# Patient Record
Sex: Female | Born: 1950 | Hispanic: Yes | Smoking: Never smoker
Health system: Southern US, Community
[De-identification: ages and names within clinical notes are randomized; demographics above are authoritative.]

## PROBLEM LIST (undated history)

## (undated) DIAGNOSIS — I10 Essential (primary) hypertension: Secondary | ICD-10-CM

## (undated) HISTORY — PX: OTHER SURGICAL HISTORY: SHX169

## (undated) HISTORY — DX: Essential (primary) hypertension: I10

## (undated) HISTORY — PX: HERNIA REPAIR: SHX51

---

## 2010-04-19 ENCOUNTER — Ambulatory Visit: Payer: Self-pay | Admitting: Gynecology

## 2010-04-19 ENCOUNTER — Other Ambulatory Visit
Admission: RE | Admit: 2010-04-19 | Discharge: 2010-04-19 | Payer: Self-pay | Source: Home / Self Care | Admitting: Gynecology

## 2010-05-22 ENCOUNTER — Ambulatory Visit
Admission: RE | Admit: 2010-05-22 | Discharge: 2010-05-22 | Payer: Self-pay | Source: Home / Self Care | Attending: Gynecology | Admitting: Gynecology

## 2010-06-17 ENCOUNTER — Emergency Department (HOSPITAL_COMMUNITY)
Admission: EM | Admit: 2010-06-17 | Discharge: 2010-06-17 | Disposition: A | Discharge status: Home / Self Care | Payer: BC Managed Care – PPO | Attending: Emergency Medicine | Admitting: Emergency Medicine

## 2010-06-17 DIAGNOSIS — I498 Other specified cardiac arrhythmias: Secondary | ICD-10-CM | POA: Insufficient documentation

## 2010-06-17 DIAGNOSIS — I10 Essential (primary) hypertension: Secondary | ICD-10-CM | POA: Insufficient documentation

## 2010-06-17 LAB — DIFFERENTIAL
Basophils Absolute: 0 K/uL (ref 0.0–0.1)
Basophils Relative: 0 % (ref 0–1)
Eosinophils Absolute: 0.2 10*3/uL (ref 0.0–0.7)
Eosinophils Relative: 3 % (ref 0–5)
Lymphocytes Relative: 33 % (ref 12–46)
Lymphs Abs: 2.2 10*3/uL (ref 0.7–4.0)
Monocytes Absolute: 0.5 K/uL (ref 0.1–1.0)
Monocytes Relative: 7 % (ref 3–12)
Neutro Abs: 3.9 K/uL (ref 1.7–7.7)
Neutrophils Relative %: 57 % (ref 43–77)

## 2010-06-17 LAB — BASIC METABOLIC PANEL WITH GFR
CO2: 29 meq/L (ref 19–32)
Calcium: 9.5 mg/dL (ref 8.4–10.5)
Chloride: 102 meq/L (ref 96–112)
Glucose, Bld: 99 mg/dL (ref 70–99)
Sodium: 139 meq/L (ref 135–145)

## 2010-06-17 LAB — POCT CARDIAC MARKERS
CKMB, poc: <1 ng/mL — ABNORMAL LOW (ref 1.0–8.0)
Myoglobin, poc: 63.4 ng/mL (ref 12–200)
Troponin i, poc: <0.05 ng/mL (ref 0.00–0.09)

## 2010-06-17 LAB — BASIC METABOLIC PANEL
BUN: 13 mg/dL (ref 6–23)
Creatinine, Ser: 0.66 mg/dL (ref 0.4–1.2)
GFR calc Af Amer: >60 mL/min (ref >60)
GFR calc non Af Amer: >60 mL/min (ref >60)
Potassium: 3.8 mEq/L (ref 3.5–5.1)

## 2010-06-17 LAB — CBC
HCT: 39.7 % (ref 36.0–46.0)
Hemoglobin: 13.2 g/dL (ref 12.0–15.0)
MCH: 30 pg (ref 26.0–34.0)
MCHC: 33.2 g/dL (ref 30.0–36.0)
MCV: 90.2 fL (ref 78.0–100.0)
Platelets: 230 10*3/uL (ref 150–400)
RBC: 4.4 MIL/uL (ref 3.87–5.11)
RDW: 13.7 % (ref 11.5–15.5)
WBC: 6.8 10*3/uL (ref 4.0–10.5)

## 2015-09-08 ENCOUNTER — Telehealth: Payer: Self-pay | Admitting: Internal Medicine

## 2015-09-08 NOTE — Telephone Encounter (Signed)
Caller name: Teresa Burch Relationship to patient: sister Can be reached: [phone]   Reason for call: Teresa Burch is current pt with Dr. Drue NovelPaz and she would like him to see pt (her sister). Pt has [employer] M[employer]. Please advise.

## 2015-09-08 NOTE — Telephone Encounter (Signed)
Please have Pt send medical records before appt.

## 2015-09-08 NOTE — Telephone Encounter (Signed)
That is okay, schedule at her convenience 

## 2015-09-12 NOTE — Telephone Encounter (Signed)
LM to set up new pt appt (non urgent)

## 2015-12-06 ENCOUNTER — Ambulatory Visit: Payer: Self-pay | Admitting: Internal Medicine

## 2016-01-08 ENCOUNTER — Telehealth: Payer: Self-pay | Admitting: Behavioral Health

## 2016-01-08 NOTE — Telephone Encounter (Signed)
Attempted to reach patient for Pre-Visit Call. Unable to leave a message; voicemail unavailable.

## 2016-01-09 ENCOUNTER — Ambulatory Visit (INDEPENDENT_AMBULATORY_CARE_PROVIDER_SITE_OTHER): Payer: Medicare HMO | Admitting: Internal Medicine

## 2016-01-09 ENCOUNTER — Encounter: Payer: Self-pay | Admitting: Internal Medicine

## 2016-01-09 VITALS — BP 126/74 | HR 88 | Temp 97.8°F | Resp 14 | Ht 63.5 in | Wt 106.0 lb

## 2016-01-09 DIAGNOSIS — Z0000 Encounter for general adult medical examination without abnormal findings: Secondary | ICD-10-CM | POA: Diagnosis not present

## 2016-01-09 DIAGNOSIS — Z1231 Encounter for screening mammogram for malignant neoplasm of breast: Secondary | ICD-10-CM

## 2016-01-09 DIAGNOSIS — Z01419 Encounter for gynecological examination (general) (routine) without abnormal findings: Secondary | ICD-10-CM

## 2016-01-09 DIAGNOSIS — I10 Essential (primary) hypertension: Secondary | ICD-10-CM

## 2016-01-09 MED ORDER — LOSARTAN POTASSIUM-HCTZ 100-25 MG PO TABS: 1.0000 | ORAL_TABLET | Freq: Every day | ORAL | 2 refills | Status: DC

## 2016-01-09 NOTE — Progress Notes (Signed)
Pre visit review using our clinic review tool, if applicable. No additional management support is needed unless otherwise documented below in the visit note. 

## 2016-01-09 NOTE — Progress Notes (Signed)
Subjective:    Patient ID: Teresa Burch, female    DOB: Jul 12, 1950, 65 y.o.   MRN: [id number]  DOS:  01/09/2016 Type of visit - description : new pt, request a CPX, here w/ her sister  Interval history: Has a history of hypertension, good compliance of medication without apparent side effects. In general feels well.   Review of Systems Constitutional: No fever. No chills. No unexplained wt changes. No unusual sweats  HEENT: No dental problems, no ear discharge, no facial swelling, no voice changes. No eye discharge, no eye  redness , no  intolerance to light   Respiratory: No wheezing , no  difficulty breathing. No cough , no mucus production  Cardiovascular: No CP, no leg swelling , no  Palpitations  GI: no nausea, no vomiting, no diarrhea , no  abdominal pain.  No blood in the stools. No dysphagia, no odynophagia    Endocrine: No polyphagia, no polyuria , no polydipsia  GU: No dysuria, gross hematuria, difficulty urinating. No urinary urgency, no frequency.  Musculoskeletal: No joint swellings or unusual aches or pains  Skin: No change in the color of the skin, palor , no  Rash  Allergic, immunologic: No environmental allergies , no  food allergies  Neurological: No dizziness no  syncope. No headaches. No diplopia, no slurred, no slurred speech, no motor deficits, no facial  Numbness  Hematological: No enlarged lymph nodes, no easy bruising , no unusual bleedings  Psychiatry: No suicidal ideas, no hallucinations, no beavior problems, no confusion.  No unusual/severe anxiety, no depression   Past Medical History:  Diagnosis Date  . Hypertension     Past Surgical History:  Procedure Laterality Date  . BTL     1980s  . HERNIA REPAIR  ~2005    Social History   Social History  . Marital status: Legally Separated    Spouse name: N/A  . Number of children: 3  . Years of education: N/A   Occupational History  . retired, [employer]    Social History Main  Topics  . Smoking status: Never Smoker  . Smokeless tobacco: Never Used  . Alcohol use No  . Drug use: No  . Sexual activity: Not on file   Other Topics Concern  . Not on file   Social History Narrative   From [place]   Moved to [place] 1967     Family History  Problem Relation Age of Onset  . CAD Father     MI age 67  . Lung cancer Sister   . Breast cancer Sister   . Diabetes Neg Hx   . Colon cancer Neg Hx       Medication List       Accurate as of 01/09/16 11:59 PM. Always use your most recent med list.          losartan-hydrochlorothiazide 100-25 MG tablet Commonly known as:  HYZAAR Take 1 tablet by mouth daily.   PROBIOTIC DAILY PO Take 1 tablet by mouth daily.          Objective:   Physical Exam BP 126/74 (BP Location: Left Arm, Patient Position: Sitting, Cuff Size: Normal)   Pulse 88   Temp 97.8 F (36.6 C) (Oral)   Resp 14   Ht 5' 3.5" (1.613 m)   Wt 106 lb (48.1 kg)   SpO2 97%   BMI 18.48 kg/m   General:   Well developed, well nourished . NAD.  Neck: No  thyromegaly  HEENT:  Normocephalic . Face symmetric, atraumatic Lungs:  CTA B Normal respiratory effort, no intercostal retractions, no accessory muscle use. Heart: RRR,  no murmur.  No pretibial edema bilaterally  Abdomen:  Not distended, soft, non-tender. No rebound or rigidity.   Skin: Exposed areas without rash. Not pale. Not jaundice Neurologic:  alert & oriented X3.  Speech normal, gait appropriate for age and unassisted Strength symmetric and appropriate for age.  Psych: Cognition and judgment appear intact.  Cooperative with normal attention span and concentration.  Behavior appropriate. No anxious or depressed appearing.    Assessment & Plan:   Assessment  (new pt 12-2015, previous PCP dr Hilma Favors, @ [place], [place]) HTN - dx ~ 2015  Plan: HTN: Seems well-controlled on Hyzaar, check labs, refill medications . RTC 6 months

## 2016-01-09 NOTE — Patient Instructions (Addendum)
GO TO THE LAB : Get the blood work     GO TO THE FRONT DESK Schedule your next appointment for a  checkup in 6 months   Front desk: get a ROI from the patient and fax it to Dr Hilma FavorsSchaeffer, @ [place], [place] Request all records

## 2016-01-09 NOTE — Assessment & Plan Note (Addendum)
Immunizations? Does not recall any previous immunizations except for flu shot, she had it twice before, "got sick", strongly declined a flu shot today. Will get records from previous PCP Female care:  -Saw Dr. Lily PeerFernandez few years ago, refer back to him - Rx a mammogram  -Bone density test done@gynecology . Colon cancer screening: Reports never had a colonoscopy, discuss screening modalities, elected IFOB Diet and exercise discussed Labs: CMP, FLP, CBC, TSH, vitamin D. EKG: Sinus rhythm , no acute changes Q wave in V1

## 2016-01-10 DIAGNOSIS — Z09 Encounter for follow-up examination after completed treatment for conditions other than malignant neoplasm: Secondary | ICD-10-CM | POA: Insufficient documentation

## 2016-01-10 LAB — CBC WITH DIFFERENTIAL/PLATELET
BASOS PCT: 0.4 % (ref 0.0–3.0)
Basophils Absolute: 0 10*3/uL (ref 0.0–0.1)
EOS PCT: 2.8 % (ref 0.0–5.0)
Eosinophils Absolute: 0.2 10*3/uL (ref 0.0–0.7)
HCT: 38.1 % (ref 36.0–46.0)
HEMOGLOBIN: 12.9 g/dL (ref 12.0–15.0)
LYMPHS ABS: 2.1 10*3/uL (ref 0.7–4.0)
Lymphocytes Relative: 32.2 % (ref 12.0–46.0)
MCHC: 33.9 g/dL (ref 30.0–36.0)
MCV: 87.8 fl (ref 78.0–100.0)
MONO ABS: 0.5 10*3/uL (ref 0.1–1.0)
MONOS PCT: 7.7 % (ref 3.0–12.0)
NEUTROS PCT: 56.9 % (ref 43.0–77.0)
Neutro Abs: 3.8 10*3/uL (ref 1.4–7.7)
Platelets: 232 10*3/uL (ref 150.0–400.0)
RBC: 4.34 Mil/uL (ref 3.87–5.11)
RDW: 14.1 % (ref 11.5–15.5)
WBC: 6.7 10*3/uL (ref 4.0–10.5)

## 2016-01-10 LAB — TSH: TSH: 1.24 u[IU]/mL (ref 0.35–4.50)

## 2016-01-10 LAB — COMPREHENSIVE METABOLIC PANEL
ALBUMIN: 3.9 g/dL (ref 3.5–5.2)
ALK PHOS: 52 U/L (ref 39–117)
ALT: 17 U/L (ref 0–35)
AST: 22 U/L (ref 0–37)
BILIRUBIN TOTAL: 0.7 mg/dL (ref 0.2–1.2)
BUN: 13 mg/dL (ref 6–23)
CALCIUM: 9.1 mg/dL (ref 8.4–10.5)
CO2: 34 mEq/L — ABNORMAL HIGH (ref 19–32)
Chloride: 99 mEq/L (ref 96–112)
Creatinine, Ser: 0.75 mg/dL (ref 0.40–1.20)
GFR: 81.79 mL/min (ref >60.00)
Glucose, Bld: 83 mg/dL (ref 70–99)
POTASSIUM: 4.1 meq/L (ref 3.5–5.1)
Sodium: 137 mEq/L (ref 135–145)
TOTAL PROTEIN: 7 g/dL (ref 6.0–8.3)

## 2016-01-10 LAB — LIPID PANEL
CHOLESTEROL: 211 mg/dL — AB (ref 0–200)
HDL: 49.9 mg/dL (ref >39.00)
LDL Cholesterol: 140 mg/dL — ABNORMAL HIGH (ref 0–99)
NONHDL: 160.99
TRIGLYCERIDES: 104 mg/dL (ref 0.0–149.0)
Total CHOL/HDL Ratio: 4
VLDL: 20.8 mg/dL (ref 0.0–40.0)

## 2016-01-10 NOTE — Assessment & Plan Note (Signed)
HTN: Seems well-controlled on Hyzaar, check labs, refill medications . RTC 6 months

## 2016-01-12 LAB — VITAMIN D 1,25 DIHYDROXY
VITAMIN D 1, 25 (OH) TOTAL: 36 pg/mL (ref 18–72)
VITAMIN D3 1, 25 (OH): 36 pg/mL
Vitamin D2 1, 25 (OH)2: <8 pg/mL

## 2016-02-08 ENCOUNTER — Ambulatory Visit (INDEPENDENT_AMBULATORY_CARE_PROVIDER_SITE_OTHER): Payer: Medicare HMO | Admitting: Gynecology

## 2016-02-08 ENCOUNTER — Encounter: Payer: Self-pay | Admitting: Gynecology

## 2016-02-08 VITALS — BP 130/82 | Ht 63.0 in | Wt 173.0 lb

## 2016-02-08 DIAGNOSIS — Z01419 Encounter for gynecological examination (general) (routine) without abnormal findings: Secondary | ICD-10-CM

## 2016-02-08 DIAGNOSIS — Z780 Asymptomatic menopausal state: Secondary | ICD-10-CM

## 2016-02-08 NOTE — Patient Instructions (Signed)
Densitometra sea (Bone Densitometry) La densitometra sea es un estudio de diagnstico por imgenes en el que se utiliza una radiografa especial que mide la cantidad de calcio y otros minerales en los huesos (densidad sea). Este estudio tambin se conoce como examen de densidad mineral sea o radioabsorciometra de doble energa (DEXA). Puede medir la densidad sea en la cadera y la columna. Es similar a una radiografa comn. Tambin pueden hacerle este estudio para:  Diagnosticar una enfermedad que causa huesos dbiles o delgados (osteoporosis).  Predecir el riesgo de un hueso roto (fractura).  Determinar si el tratamiento para la osteoporosis funciona. INFORME A SU MDICO:  Cualquier alergia que tenga.  Todos los medicamentos que utiliza, incluidos vitaminas, hierbas, gotas oftlmicas, cremas y medicamentos de venta libre.  Problemas previos que usted o los miembros de su familia hayan tenido con el uso de anestsicos.  Enfermedades de la sangre que tenga.  Si tiene cirugas previas.  Enfermedades que tenga.  Probabilidad de embarazo.  Cualquier otro estudio mdico al que se haya sometido en los ltimos 14 das en el que se haya utilizado material de contraste. RIESGOS Y COMPLICACIONES En general, se trata de un procedimiento seguro. Sin embargo, pueden ocurrir algunos problemas, entre los que se pueden incluir los siguientes:  Este estudio lo expone a una cantidad muy pequea de radiacin.  Los riesgos de la exposicin a la radiacin pueden ser mayores para los nios por nacer. ANTES DEL PROCEDIMIENTO  No tome ningn suplemento de calcio durante 24 horas antes de realizarse el estudio. Puede comer y beber como lo hace habitualmente.  Qutese todas las joyas de metal, anteojos, aparatos dentales y cualquier otro objeto metlico. PROCEDIMIENTO  Deber recostarse en una camilla. Un generador de rayos X estar ubicado debajo de usted y un dispositivo de imgenes, por  encima.  Se pueden usar otros dispositivos, como cajas o abrazaderas, para posicionar el cuerpo apropiadamente para la exploracin.  Deber permanecer inmvil mientras la mquina explore lentamente su cuerpo.  Las imgenes se muestran en el monitor de una computadora. DESPUS DEL PROCEDIMIENTO Es posible que necesite estudios adicionales ms adelante.   Esta informacin no tiene como fin reemplazar el consejo del mdico. Asegrese de hacerle al mdico cualquier pregunta que tenga.   Document Released: 01/01/2012 Document Revised: 04/29/2014 Elsevier Interactive Patient Education 2016 Elsevier Inc.  

## 2016-02-08 NOTE — Addendum Note (Signed)
Addended by: Berna SpareASTILLO, Tashunda Vandezande A on: 02/08/2016 11:50 AM   Modules accepted: Orders

## 2016-02-08 NOTE — Progress Notes (Signed)
Teresa Burch [birth year] [id number]   History:    65 y.o.  for annual gyn exam who has not been seen the practice since 2012. Her PCP is Dr. Drue NovelPaz who is treating her for hypertension and doing her blood work. See separate note. Patient postmenopausal never been on hormone replacement therapy. When she was last here in the office in 2012 she had a normal bone density study. She has not had a mammogram in over 8 years. She does do her monthly breast exams. She declined the flu vaccine today. She has not had a colonoscopy as of yet.  Past medical history,surgical history, family history and social history were all reviewed and documented in the EPIC chart.  Gynecologic History No LMP recorded. Patient has had a hysterectomy. Contraception: post menopausal status Last Pap: 2011. Results were: normal Last mammogram: Over 8 years ago. Results were: normal  Obstetric History OB History  Gravida Para Term Preterm AB Living  5 3     2 3   SAB TAB Ectopic Multiple Live Births  2            # Outcome Date GA Lbr Len/2nd Weight Sex Delivery Anes PTL Lv  5 SAB           4 SAB           3 Para           2 Para           1 Para                ROS: A ROS was performed and pertinent positives and negatives are included in the history.  GENERAL: No fevers or chills. HEENT: No change in vision, no earache, sore throat or sinus congestion. NECK: No pain or stiffness. CARDIOVASCULAR: No chest pain or pressure. No palpitations. PULMONARY: No shortness of breath, cough or wheeze. GASTROINTESTINAL: No abdominal pain, nausea, vomiting or diarrhea, melena or bright red blood per rectum. GENITOURINARY: No urinary frequency, urgency, hesitancy or dysuria. MUSCULOSKELETAL: No joint or muscle pain, no back pain, no recent trauma. DERMATOLOGIC: No rash, no itching, no lesions. ENDOCRINE: No polyuria, polydipsia, no heat or cold intolerance. No recent change in weight. HEMATOLOGICAL: No anemia or easy bruising or  bleeding. NEUROLOGIC: No headache, seizures, numbness, tingling or weakness. PSYCHIATRIC: No depression, no loss of interest in normal activity or change in sleep pattern.     Exam: chaperone present  BP 130/82   Ht 5\' 3"  (1.6 m)   Wt 173 lb (78.5 kg)   BMI 30.65 kg/m   Body mass index is 30.65 kg/m.  General appearance : Well developed well nourished female. No acute distress HEENT: Eyes: no retinal hemorrhage or exudates,  Neck supple, trachea midline, no carotid bruits, no thyroidmegaly Lungs: Clear to auscultation, no rhonchi or wheezes, or rib retractions  Heart: Regular rate and rhythm, no murmurs or gallops Breast:Examined in sitting and supine position were symmetrical in appearance, no palpable masses or tenderness,  no skin retraction, no nipple inversion, no nipple discharge, no skin discoloration, no axillary or supraclavicular lymphadenopathy Abdomen: no palpable masses or tenderness, no rebound or guarding Extremities: no edema or skin discoloration or tenderness  Pelvic:  Bartholin, Urethra, Skene Glands: Within normal limits             Vagina: No gross lesions or discharge, atrophic changes  Cervix: No gross lesions or discharge  Uterus  anteverted, normal size, shape and consistency, non-tender  and mobile  Adnexa  Without masses or tenderness  Anus and perineum  normal   Rectovaginal  normal sphincter tone without palpated masses or tenderness             Hemoccult PCP provides     Assessment/Plan:  65 y.o. female for annual exam who was long overdue for this evaluation today. A Pap smear with HPV screening was done today. PCP has been done her blood work. Patient declined flu vaccine. I provided her with a requisition to schedule her mammogram. She will also schedule for a bone density study here in our office. We discussed importance of calcium vitamin D and weightbearing exercises for osteoporosis prevention. We discussed importance of monthly breast  exams.   Ok Edwards MD, 11:41 AM 02/08/2016

## 2016-02-12 LAB — PAP, TP IMAGING W/ HPV RNA, RFLX HPV TYPE 16,18/45: HPV mRNA, High Risk: NOT DETECTED

## 2016-03-11 ENCOUNTER — Encounter (HOSPITAL_BASED_OUTPATIENT_CLINIC_OR_DEPARTMENT_OTHER): Payer: Self-pay | Admitting: Radiology

## 2016-03-11 ENCOUNTER — Ambulatory Visit (HOSPITAL_BASED_OUTPATIENT_CLINIC_OR_DEPARTMENT_OTHER)
Admission: RE | Admit: 2016-03-11 | Discharge: 2016-03-11 | Disposition: A | Discharge status: Home / Self Care | Payer: Medicare HMO | Source: Ambulatory Visit | Attending: Internal Medicine | Admitting: Internal Medicine

## 2016-03-11 ENCOUNTER — Ambulatory Visit (HOSPITAL_BASED_OUTPATIENT_CLINIC_OR_DEPARTMENT_OTHER)
Admission: RE | Admit: 2016-03-11 | Discharge: 2016-03-11 | Disposition: A | Discharge status: Home / Self Care | Payer: Medicare HMO | Source: Ambulatory Visit | Attending: Gynecology | Admitting: Gynecology

## 2016-03-11 DIAGNOSIS — Z780 Asymptomatic menopausal state: Secondary | ICD-10-CM

## 2016-03-11 DIAGNOSIS — Z1231 Encounter for screening mammogram for malignant neoplasm of breast: Secondary | ICD-10-CM | POA: Insufficient documentation

## 2016-03-19 ENCOUNTER — Other Ambulatory Visit: Payer: Self-pay | Admitting: Internal Medicine

## 2016-03-19 DIAGNOSIS — R928 Other abnormal and inconclusive findings on diagnostic imaging of breast: Secondary | ICD-10-CM

## 2016-03-22 HISTORY — PX: BREAST BIOPSY: SHX20

## 2016-03-26 ENCOUNTER — Other Ambulatory Visit: Payer: Medicare HMO

## 2016-03-29 ENCOUNTER — Other Ambulatory Visit (INDEPENDENT_AMBULATORY_CARE_PROVIDER_SITE_OTHER): Payer: Medicare HMO

## 2016-03-29 DIAGNOSIS — Z0000 Encounter for general adult medical examination without abnormal findings: Secondary | ICD-10-CM | POA: Diagnosis not present

## 2016-03-29 LAB — FECAL OCCULT BLOOD, IMMUNOCHEMICAL: FECAL OCCULT BLD: NEGATIVE

## 2016-04-03 ENCOUNTER — Other Ambulatory Visit: Payer: Self-pay | Admitting: Internal Medicine

## 2016-04-03 ENCOUNTER — Ambulatory Visit
Admission: RE | Admit: 2016-04-03 | Discharge: 2016-04-03 | Disposition: A | Discharge status: Home / Self Care | Payer: Medicare HMO | Source: Ambulatory Visit | Attending: Internal Medicine | Admitting: Internal Medicine

## 2016-04-03 DIAGNOSIS — R928 Other abnormal and inconclusive findings on diagnostic imaging of breast: Secondary | ICD-10-CM

## 2016-04-03 DIAGNOSIS — N6320 Unspecified lump in the left breast, unspecified quadrant: Secondary | ICD-10-CM

## 2016-04-04 ENCOUNTER — Ambulatory Visit
Admission: RE | Admit: 2016-04-04 | Discharge: 2016-04-04 | Disposition: A | Discharge status: Home / Self Care | Payer: Medicare HMO | Source: Ambulatory Visit | Attending: Internal Medicine | Admitting: Internal Medicine

## 2016-04-04 ENCOUNTER — Other Ambulatory Visit: Payer: Self-pay | Admitting: Internal Medicine

## 2016-04-04 DIAGNOSIS — N6320 Unspecified lump in the left breast, unspecified quadrant: Secondary | ICD-10-CM

## 2016-07-08 ENCOUNTER — Ambulatory Visit (INDEPENDENT_AMBULATORY_CARE_PROVIDER_SITE_OTHER): Payer: Medicare HMO | Admitting: Internal Medicine

## 2016-07-08 ENCOUNTER — Encounter: Payer: Self-pay | Admitting: Internal Medicine

## 2016-07-08 VITALS — BP 124/78 | HR 71 | Temp 97.8°F | Resp 14 | Ht 63.0 in | Wt 177.1 lb

## 2016-07-08 DIAGNOSIS — I10 Essential (primary) hypertension: Secondary | ICD-10-CM | POA: Diagnosis not present

## 2016-07-08 MED ORDER — LOSARTAN POTASSIUM-HCTZ 100-25 MG PO TABS: 1.0000 | ORAL_TABLET | Freq: Every day | ORAL | 2 refills | Status: DC

## 2016-07-08 NOTE — Assessment & Plan Note (Signed)
HTN: Seems well-controlled with Hyzaar, check a BMP and refill medications. Primary care:  Since the last time she was here saw gyn. Had a mammogram follow-up by left breast biopsy 03/2016, fortunately it was negative. Bone density test 02-2016 negative. Discussed immunizations including Td and pneumonia shot: Strongly declined, benefits discussed. RTC 6-8 months, CPX

## 2016-07-08 NOTE — Progress Notes (Signed)
Subjective:    Patient ID: Teresa Burch, female    DOB: 07-28-1950, 66 y.o.   MRN: [id number]  DOS:  07/08/2016 Type of visit - description : rov Interval history: Since the last office visit she is doing well. Good compliance with BP medications, ambulatory BPs are rarely checked but  usually normal. Since the last visit saw gynecology, had a bone density test and a breast biopsy. Records reviewed.  Wt Readings from Last 3 Encounters:  07/08/16 177 lb 2 oz (80.3 kg)  02/08/16 173 lb (78.5 kg)  01/09/16 106 lb (48.1 kg)     Review of Systems Has gained some weight, admits that she has not been very active but plans do better now that the weather is not cold Denies chest pain or difficulty breathing No nausea, vomiting, diarrhea.   Past Medical History:  Diagnosis Date  . Hypertension     Past Surgical History:  Procedure Laterality Date  . BTL     1980s  . HERNIA REPAIR  ~2005    Social History   Social History  . Marital status: Legally Separated    Spouse name: N/A  . Number of children: 3  . Years of education: N/A   Occupational History  . retired, [employer]    Social History Main Topics  . Smoking status: Never Smoker  . Smokeless tobacco: Never Used  . Alcohol use No  . Drug use: No  . Sexual activity: Not on file   Other Topics Concern  . Not on file   Social History Narrative   From [place]   Moved to [place] 1967      Allergies as of 07/08/2016      Reactions   Aspirin Other (See Comments)   GI upset      Medication List       Accurate as of 07/08/16  7:30 PM. Always use your most recent med list.          losartan-hydrochlorothiazide 100-25 MG tablet Commonly known as:  HYZAAR Take 1 tablet by mouth daily.   PROBIOTIC DAILY PO Take 1 tablet by mouth daily.          Objective:   Physical Exam BP 124/78 (BP Location: Left Arm, Patient Position: Sitting, Cuff Size: Small)   Pulse 71   Temp 97.8 F (36.6 C)  (Oral)   Resp 14   Ht 5\' 3"  (1.6 m)   Wt 177 lb 2 oz (80.3 kg)   SpO2 97%   BMI 31.38 kg/m  General:   Well developed, well nourished . NAD.  HEENT:  Normocephalic . Face symmetric, atraumatic Lungs:  CTA B Normal respiratory effort, no intercostal retractions, no accessory muscle use. Heart: RRR,  no murmur.  No pretibial edema bilaterally  Skin: Not pale. Not jaundice Neurologic:  alert & oriented X3.  Speech normal, gait appropriate for age and unassisted Psych--  Cognition and judgment appear intact.  Cooperative with normal attention span and concentration.  Behavior appropriate. No anxious or depressed appearing.      Assessment & Plan:   Assessment  (new pt 12-2015, previous PCP dr Hilma FavorsSchaeffer, @ [place], [place]) HTN - dx ~ 2015 L Breast biopsy (-) 03/2016  PLAN:  HTN: Seems well-controlled with Hyzaar, check a BMP and refill medications. Primary care:  Since the last time she was here saw gyn. Had a mammogram follow-up by left breast biopsy 03/2016, fortunately it was negative. Bone density test 02-2016 negative. Discussed immunizations  including Td and pneumonia shot: Strongly declined, benefits discussed. RTC 6-8 months, CPX

## 2016-07-08 NOTE — Progress Notes (Signed)
Pre visit review using our clinic review tool, if applicable. No additional management support is needed unless otherwise documented below in the visit note. 

## 2016-07-08 NOTE — Patient Instructions (Signed)
GO TO THE LAB : Get the blood work     GO TO THE FRONT DESK Schedule your next appointment for a  physical exam, fasting in 6-8 months

## 2016-07-09 LAB — BASIC METABOLIC PANEL WITH GFR
BUN: 12 mg/dL (ref 6–23)
CO2: 32 meq/L (ref 19–32)
Calcium: 10 mg/dL (ref 8.4–10.5)
Chloride: 100 meq/L (ref 96–112)
Creatinine, Ser: 0.73 mg/dL (ref 0.40–1.20)
GFR: 84.26 mL/min
Glucose, Bld: 92 mg/dL (ref 70–99)
Potassium: 4.1 meq/L (ref 3.5–5.1)
Sodium: 137 meq/L (ref 135–145)

## 2016-09-04 ENCOUNTER — Encounter: Payer: Self-pay | Admitting: Gynecology

## 2017-03-10 ENCOUNTER — Ambulatory Visit (INDEPENDENT_AMBULATORY_CARE_PROVIDER_SITE_OTHER): Payer: Medicare HMO | Admitting: Internal Medicine

## 2017-03-10 ENCOUNTER — Telehealth: Payer: Self-pay

## 2017-03-10 ENCOUNTER — Encounter: Payer: Self-pay | Admitting: Internal Medicine

## 2017-03-10 VITALS — BP 118/72 | HR 86 | Temp 97.6°F | Resp 14 | Ht 63.0 in | Wt 169.4 lb

## 2017-03-10 DIAGNOSIS — Z1231 Encounter for screening mammogram for malignant neoplasm of breast: Secondary | ICD-10-CM | POA: Diagnosis not present

## 2017-03-10 DIAGNOSIS — Z0000 Encounter for general adult medical examination without abnormal findings: Secondary | ICD-10-CM | POA: Diagnosis not present

## 2017-03-10 DIAGNOSIS — Z1159 Encounter for screening for other viral diseases: Secondary | ICD-10-CM

## 2017-03-10 LAB — CBC WITH DIFFERENTIAL/PLATELET
BASOS ABS: 0.1 10*3/uL (ref 0.0–0.1)
Basophils Relative: 0.8 % (ref 0.0–3.0)
EOS ABS: 0.2 10*3/uL (ref 0.0–0.7)
Eosinophils Relative: 2.4 % (ref 0.0–5.0)
HCT: 40.5 % (ref 36.0–46.0)
Hemoglobin: 13.2 g/dL (ref 12.0–15.0)
LYMPHS ABS: 1.8 10*3/uL (ref 0.7–4.0)
Lymphocytes Relative: 27.1 % (ref 12.0–46.0)
MCHC: 32.6 g/dL (ref 30.0–36.0)
MCV: 91 fl (ref 78.0–100.0)
MONO ABS: 0.5 10*3/uL (ref 0.1–1.0)
Monocytes Relative: 7.2 % (ref 3.0–12.0)
NEUTROS PCT: 62.5 % (ref 43.0–77.0)
Neutro Abs: 4.2 10*3/uL (ref 1.4–7.7)
Platelets: 245 10*3/uL (ref 150.0–400.0)
RBC: 4.45 Mil/uL (ref 3.87–5.11)
RDW: 13.9 % (ref 11.5–15.5)
WBC: 6.7 10*3/uL (ref 4.0–10.5)

## 2017-03-10 LAB — LIPID PANEL
CHOL/HDL RATIO: 4
Cholesterol: 196 mg/dL (ref 0–200)
HDL: 48.6 mg/dL (ref >39.00)
LDL CALC: 130 mg/dL — AB (ref 0–99)
NONHDL: 147.4
Triglycerides: 87 mg/dL (ref 0.0–149.0)
VLDL: 17.4 mg/dL (ref 0.0–40.0)

## 2017-03-10 LAB — COMPREHENSIVE METABOLIC PANEL
ALT: 20 U/L (ref 0–35)
AST: 23 U/L (ref 0–37)
Albumin: 4 g/dL (ref 3.5–5.2)
Alkaline Phosphatase: 53 U/L (ref 39–117)
BUN: 18 mg/dL (ref 6–23)
CO2: 31 meq/L (ref 19–32)
Calcium: 9.6 mg/dL (ref 8.4–10.5)
Chloride: 102 mEq/L (ref 96–112)
Creatinine, Ser: 0.75 mg/dL (ref 0.40–1.20)
GFR: 81.51 mL/min (ref >60.00)
GLUCOSE: 99 mg/dL (ref 70–99)
POTASSIUM: 4.2 meq/L (ref 3.5–5.1)
SODIUM: 138 meq/L (ref 135–145)
TOTAL PROTEIN: 7.1 g/dL (ref 6.0–8.3)
Total Bilirubin: 0.6 mg/dL (ref 0.2–1.2)

## 2017-03-10 LAB — HEPATITIS C ANTIBODY
HEP C AB: NONREACTIVE
SIGNAL TO CUT-OFF: 0.02 (ref <1.00)

## 2017-03-10 NOTE — Progress Notes (Signed)
Pre visit review using our clinic review tool, if applicable. No additional management support is needed unless otherwise documented below in the visit note. 

## 2017-03-10 NOTE — Telephone Encounter (Signed)
Cologuard ordered through [facility] online portal. Order number: [id number].

## 2017-03-10 NOTE — Patient Instructions (Signed)
GO TO THE LAB : Get the blood work     GO TO THE FRONT DESK Schedule your next appointment for a routine checkup in 6-7 months   La llamaran acerca de su mamografia  La llamaran acerca de laprueba de heces que se llama COLOGUARD

## 2017-03-10 NOTE — Assessment & Plan Note (Addendum)
-  Immunizations: none recorded.  Discussed Td, flu shot, pnm declined ALL -Female care:  Pap with HPV neg 01/2016 Normal DEXA 02/2016 MMG w/ L breast biopsy 03/2016, fortunately it was negative. Referral for a MMG sent  -CCS: (-)  IFOB 03-2016; pro-cons of screening modalities discussed, pt elected cologuard -Diet and exercise discussed, she is doing great, has lost some weight but change her diet - labs: CMP, FLP, CBC, hep C

## 2017-03-10 NOTE — Progress Notes (Signed)
Subjective:    Patient ID: Teresa Burch, female    DOB: Aug 03, 1950, 66 y.o.   MRN: [id number]  DOS:  03/10/2017 Type of visit - description : cpx Interval history: No major concerns, feeling well. Changed her diet, has lost weight.  Wt Readings from Last 3 Encounters:  03/10/17 169 lb 6 oz (76.8 kg)  07/08/16 177 lb 2 oz (80.3 kg)  02/08/16 173 lb (78.5 kg)     Review of Systems   A 14 point review of systems is negative    Past Medical History:  Diagnosis Date  . Hypertension     Past Surgical History:  Procedure Laterality Date  . BREAST BIOPSY Left 03/2016   (-)  . BTL     1980s  . HERNIA REPAIR  ~2005    Social History   Socioeconomic History  . Marital status: Legally Separated    Spouse name: Not on file  . Number of children: 3  . Years of education: Not on file  . Highest education level: Not on file  Social Needs  . Financial resource strain: Not on file  . Food insecurity - worry: Not on file  . Food insecurity - inability: Not on file  . Transportation needs - medical: Not on file  . Transportation needs - non-medical: Not on file  Occupational History  . Occupation: retired, [employer]  Tobacco Use  . Smoking status: Never Smoker  . Smokeless tobacco: Never Used  Substance and Sexual Activity  . Alcohol use: No  . Drug use: No  . Sexual activity: Not on file  Other Topics Concern  . Not on file  Social History Narrative   From [place]   Moved to [place] 1967   Lives by herself    3 daighterS, 2 daughters in [place] , [place] in [place]     Family History  Problem Relation Age of Onset  . CAD Father        MI age 361  . Lung cancer Sister [id number]       LUNG   . Breast cancer Sister   . Diabetes Neg Hx   . Colon cancer Neg Hx     Allergies as of 03/10/2017      Reactions   Aspirin Other (See Comments)   GI upset      Medication List        Accurate as of 03/10/17 11:59 PM. Always use your most recent med list.          losartan-hydrochlorothiazide 100-25 MG tablet Commonly known as:  HYZAAR Take 1 tablet by mouth daily.   PROBIOTIC DAILY PO Take 1 tablet by mouth daily.          Objective:   Physical Exam BP 118/72 (BP Location: Left Arm, Patient Position: Sitting, Cuff Size: Small)   Pulse 86   Temp 97.6 F (36.4 C) (Oral)   Resp 14   Ht 5\' 3"  (1.6 m)   Wt 169 lb 6 oz (76.8 kg)   SpO2 95%   BMI 30.00 kg/m  General:   Well developed, well nourished . NAD.  Neck: No  thyromegaly  HEENT:  Normocephalic . Face symmetric, atraumatic Lungs:  CTA B Normal respiratory effort, no intercostal retractions, no accessory muscle use. Heart: RRR,  no murmur.  No pretibial edema bilaterally  Abdomen:  Not distended, soft, non-tender. No rebound or rigidity.   Skin: Exposed areas without rash. Not pale. Not jaundice Neurologic:  alert &  oriented X3.  Speech normal, gait appropriate for age and unassisted Strength symmetric and appropriate for age.  Psych: Cognition and judgment appear intact.  Cooperative with normal attention span and concentration.  Behavior appropriate. No anxious or depressed appearing.     Assessment & Plan:   Assessment  (new pt 12-2015, previous PCP dr Hilma FavorsSchaeffer, @ [place], [place]) HTN - dx ~ 2015 L Breast biopsy (-) 03/2016  PLAN: HTN: No ambulatory BPs, BP today is very good, continue Hyzaar.  Checking labs. RTC 6-8 months

## 2017-03-11 NOTE — Assessment & Plan Note (Signed)
HTN: No ambulatory BPs, BP today is very good, continue Hyzaar.  Checking labs. RTC 6-8 months

## 2017-05-08 NOTE — Telephone Encounter (Signed)
thx

## 2017-05-08 NOTE — Telephone Encounter (Signed)
Received fax from [facility]- they received an empty Cologuard container- they are reaching out to Pt to recollect.

## 2017-06-29 ENCOUNTER — Other Ambulatory Visit: Payer: Self-pay | Admitting: Internal Medicine

## 2017-09-08 ENCOUNTER — Ambulatory Visit: Payer: Medicare HMO | Admitting: Internal Medicine

## 2018-01-05 ENCOUNTER — Other Ambulatory Visit: Payer: Self-pay | Admitting: Internal Medicine

## 2018-03-02 ENCOUNTER — Other Ambulatory Visit: Payer: Self-pay | Admitting: Internal Medicine

## 2018-03-02 DIAGNOSIS — N6002 Solitary cyst of left breast: Secondary | ICD-10-CM

## 2018-03-03 ENCOUNTER — Encounter: Payer: Medicare HMO | Admitting: Internal Medicine

## 2018-03-03 ENCOUNTER — Ambulatory Visit (INDEPENDENT_AMBULATORY_CARE_PROVIDER_SITE_OTHER): Payer: Medicare HMO | Admitting: Internal Medicine

## 2018-03-03 ENCOUNTER — Telehealth: Payer: Self-pay

## 2018-03-03 ENCOUNTER — Encounter: Payer: Self-pay | Admitting: Internal Medicine

## 2018-03-03 VITALS — BP 124/78 | HR 65 | Temp 97.9°F | Resp 16 | Ht 63.0 in | Wt 169.2 lb

## 2018-03-03 DIAGNOSIS — I10 Essential (primary) hypertension: Secondary | ICD-10-CM | POA: Diagnosis not present

## 2018-03-03 DIAGNOSIS — N644 Mastodynia: Secondary | ICD-10-CM | POA: Diagnosis not present

## 2018-03-03 DIAGNOSIS — Z87898 Personal history of other specified conditions: Secondary | ICD-10-CM

## 2018-03-03 DIAGNOSIS — Z1231 Encounter for screening mammogram for malignant neoplasm of breast: Secondary | ICD-10-CM

## 2018-03-03 LAB — CBC WITH DIFFERENTIAL/PLATELET
BASOS PCT: 1 % (ref 0.0–3.0)
Basophils Absolute: 0.1 10*3/uL (ref 0.0–0.1)
EOS PCT: 4.6 % (ref 0.0–5.0)
Eosinophils Absolute: 0.3 10*3/uL (ref 0.0–0.7)
HCT: 40.6 % (ref 36.0–46.0)
HEMOGLOBIN: 13.6 g/dL (ref 12.0–15.0)
Lymphocytes Relative: 34.3 % (ref 12.0–46.0)
Lymphs Abs: 2.5 10*3/uL (ref 0.7–4.0)
MCHC: 33.5 g/dL (ref 30.0–36.0)
MCV: 89.2 fl (ref 78.0–100.0)
MONO ABS: 0.6 10*3/uL (ref 0.1–1.0)
Monocytes Relative: 8.3 % (ref 3.0–12.0)
NEUTROS ABS: 3.7 10*3/uL (ref 1.4–7.7)
Neutrophils Relative %: 51.8 % (ref 43.0–77.0)
PLATELETS: 232 10*3/uL (ref 150.0–400.0)
RBC: 4.55 Mil/uL (ref 3.87–5.11)
RDW: 14.5 % (ref 11.5–15.5)
WBC: 7.2 10*3/uL (ref 4.0–10.5)

## 2018-03-03 LAB — COMPREHENSIVE METABOLIC PANEL
ALBUMIN: 4.4 g/dL (ref 3.5–5.2)
ALK PHOS: 59 U/L (ref 39–117)
ALT: 17 U/L (ref 0–35)
AST: 19 U/L (ref 0–37)
BUN: 17 mg/dL (ref 6–23)
CALCIUM: 9.6 mg/dL (ref 8.4–10.5)
CO2: 31 mEq/L (ref 19–32)
Chloride: 98 mEq/L (ref 96–112)
Creatinine, Ser: 0.75 mg/dL (ref 0.40–1.20)
GFR: 81.28 mL/min (ref >60.00)
Glucose, Bld: 92 mg/dL (ref 70–99)
POTASSIUM: 4 meq/L (ref 3.5–5.1)
Sodium: 137 mEq/L (ref 135–145)
TOTAL PROTEIN: 7.1 g/dL (ref 6.0–8.3)
Total Bilirubin: 0.7 mg/dL (ref 0.2–1.2)

## 2018-03-03 LAB — LIPID PANEL
CHOLESTEROL: 215 mg/dL — AB (ref 0–200)
HDL: 55.2 mg/dL (ref >39.00)
LDL CALC: 136 mg/dL — AB (ref 0–99)
NonHDL: 160.05
TRIGLYCERIDES: 119 mg/dL (ref 0.0–149.0)
Total CHOL/HDL Ratio: 4
VLDL: 23.8 mg/dL (ref 0.0–40.0)

## 2018-03-03 NOTE — Telephone Encounter (Signed)
Pt too early for CPE today- has been rescheduled. Requesting orders for mammogram---will do.

## 2018-03-03 NOTE — Progress Notes (Signed)
Pre visit review using our clinic review tool, if applicable. No additional management support is needed unless otherwise documented below in the visit note. 

## 2018-03-03 NOTE — Progress Notes (Signed)
Subjective:    Patient ID: Teresa Burch, female    DOB: [birth year], 67 y.o.   MRN: [id number]  DOS:  03/03/2018 Type of visit - description : f/u Interval history: HTN: Good compliance with medication, normal ambulatory BPs recently. Concern about losartan HCT recall.  Review of Systems Occasional mastalgia, left breast.  Self palpation normal.  No nipple discharge.   Past Medical History:  Diagnosis Date  . Hypertension     Past Surgical History:  Procedure Laterality Date  . BREAST BIOPSY Left 03/2016   (-)  . BTL     1980s  . HERNIA REPAIR  ~2005    Social History   Socioeconomic History  . Marital status: Legally Separated    Spouse name: Not on file  . Number of children: 3  . Years of education: Not on file  . Highest education level: Not on file  Occupational History  . Occupation: retired, [employer]  Social Needs  . Financial resource strain: Not on file  . Food insecurity:    Worry: Not on file    Inability: Not on file  . Transportation needs:    Medical: Not on file    Non-medical: Not on file  Tobacco Use  . Smoking status: Never Smoker  . Smokeless tobacco: Never Used  Substance and Sexual Activity  . Alcohol use: No  . Drug use: No  . Sexual activity: Not Currently  Lifestyle  . Physical activity:    Days per week: Not on file    Minutes per session: Not on file  . Stress: Not on file  Relationships  . Social connections:    Talks on phone: Not on file    Gets together: Not on file    Attends religious service: Not on file    Active member of club or organization: Not on file    Attends meetings of clubs or organizations: Not on file    Relationship status: Not on file  . Intimate partner violence:    Fear of current or ex partner: Not on file    Emotionally abused: Not on file    Physically abused: Not on file    Forced sexual activity: Not on file  Other Topics Concern  . Not on file  Social History Narrative   From  [place]   Moved to [place] 1967   Lives by herself    3 daighterS, 2 daughters in [place] , [place] in [place]      Allergies as of 03/03/2018      Reactions   Aspirin Other (See Comments)   GI upset      Medication List        Accurate as of 03/03/18 11:59 PM. Always use your most recent med list.          losartan-hydrochlorothiazide 100-25 MG tablet Commonly known as:  HYZAAR Take 1 tablet by mouth daily.   PROBIOTIC DAILY PO Take 1 tablet by mouth daily.          Objective:   Physical Exam BP 124/78 (BP Location: Left Arm, Patient Position: Sitting, Cuff Size: Small)   Pulse 65   Temp 97.9 F (36.6 C) (Oral)   Resp 16   Ht 5\' 3"  (1.6 m)   Wt 169 lb 4 oz (76.8 kg)   BMI 29.98 kg/m      General:   Well developed, NAD, BMI noted. HEENT:  Normocephalic . Face symmetric, atraumatic Breast: no dominant mass, skin  and nipples normal to inspection on palpation, axillary areas without mass or lymphadenopathy  Lungs:  CTA B Normal respiratory effort, no intercostal retractions, no accessory muscle use. Heart: RRR,  no murmur.  No pretibial edema bilaterally  Skin: Not pale. Not jaundice Neurologic:  alert & oriented X3.  Speech normal, gait appropriate for age and unassisted Psych--  Cognition and judgment appear intact.  Cooperative with normal attention span and concentration.  Behavior appropriate. No anxious or depressed appearing.   Assessment & Plan:   Assessment  (new pt 12-2015, previous PCP dr Hilma Favors, @ [place], [place]) HTN - dx ~ 2015 L Breast biopsy (-) 03/2016  PLAN: HTN: No recent ambulatory BPs, BP today is okay, continue losartan HCT, rec to discuss  concerns about BP med recall with her pharmacist.  Check a CMP, CBC, FLP. Mastalgia, left: Exam normal, due for a mammogram, will do today.  Recommend OTC vitamin D. Preventive care: Declined any and all immunizations.  Proceed with a mammogram today, breast exam normal. RTC CPX at  her convenience, no later than 6 months.

## 2018-03-03 NOTE — Patient Instructions (Addendum)
GO TO THE LAB : Get the blood work     GO TO THE FRONT DESK Schedule your next appointment for a   physical exam at your convenience,  no later than 6 months     HAGASE LA MAMOGRAFIA  HABLE CON SU FARMACISTA ACERCA DE LA PASTILLA DE LA PRESION, SI SU PASTILLA ESTA CONTAMINADA DEBEN LLAMARME PARA MANDARLE UNA NUEVA RECETA

## 2018-03-04 NOTE — Assessment & Plan Note (Signed)
HTN: No recent ambulatory BPs, BP today is okay, continue losartan HCT, rec to discuss  concerns about BP med recall with her pharmacist.  Check a CMP, CBC, FLP. Mastalgia, left: Exam normal, due for a mammogram, will do today.  Recommend OTC vitamin D. Preventive care: Declined any and all immunizations.  Proceed with a mammogram today, breast exam normal. RTC CPX at her convenience, no later than 6 months.

## 2018-03-09 ENCOUNTER — Ambulatory Visit: Admission: RE | Admit: 2018-03-09 | Payer: Medicare HMO | Source: Ambulatory Visit

## 2018-03-09 ENCOUNTER — Ambulatory Visit
Admission: RE | Admit: 2018-03-09 | Discharge: 2018-03-09 | Disposition: A | Discharge status: Home / Self Care | Payer: Medicare HMO | Source: Ambulatory Visit | Attending: Internal Medicine | Admitting: Internal Medicine

## 2018-03-18 ENCOUNTER — Encounter: Payer: Medicare HMO | Admitting: Internal Medicine

## 2018-04-07 ENCOUNTER — Other Ambulatory Visit: Payer: Self-pay | Admitting: Internal Medicine

## 2018-06-19 ENCOUNTER — Encounter: Payer: Self-pay | Admitting: Internal Medicine

## 2018-06-19 ENCOUNTER — Ambulatory Visit (INDEPENDENT_AMBULATORY_CARE_PROVIDER_SITE_OTHER): Payer: Medicare HMO | Admitting: Internal Medicine

## 2018-06-19 VITALS — BP 132/80 | HR 66 | Temp 98.0°F | Resp 16 | Ht 63.0 in | Wt 168.1 lb

## 2018-06-19 DIAGNOSIS — Z0000 Encounter for general adult medical examination without abnormal findings: Secondary | ICD-10-CM | POA: Diagnosis not present

## 2018-06-19 DIAGNOSIS — I10 Essential (primary) hypertension: Secondary | ICD-10-CM | POA: Diagnosis not present

## 2018-06-19 LAB — BASIC METABOLIC PANEL
BUN: 8 mg/dL (ref 6–23)
CO2: 31 meq/L (ref 19–32)
Calcium: 9.7 mg/dL (ref 8.4–10.5)
Chloride: 103 mEq/L (ref 96–112)
Creatinine, Ser: 0.71 mg/dL (ref 0.40–1.20)
GFR: 81.39 mL/min (ref >60.00)
GLUCOSE: 91 mg/dL (ref 70–99)
POTASSIUM: 4 meq/L (ref 3.5–5.1)
SODIUM: 141 meq/L (ref 135–145)

## 2018-06-19 LAB — LIPID PANEL
Cholesterol: 216 mg/dL — ABNORMAL HIGH (ref 0–200)
HDL: 50.3 mg/dL (ref >39.00)
LDL Cholesterol: 143 mg/dL — ABNORMAL HIGH (ref 0–99)
NONHDL: 165.58
TRIGLYCERIDES: 115 mg/dL (ref 0.0–149.0)
Total CHOL/HDL Ratio: 4
VLDL: 23 mg/dL (ref 0.0–40.0)

## 2018-06-19 NOTE — Patient Instructions (Addendum)
Please schedule Medicare Wellness with Lawanna Kobus.   GO TO THE LAB : Get the blood work     GO TO THE FRONT DESK Schedule your next appointment  For a check up in 6-8 months     Check the  blood pressure weekly   Be sure your blood pressure is between 110/65 and  135/85. If it is consistently higher or lower, let me know

## 2018-06-19 NOTE — Assessment & Plan Note (Addendum)
-  Immunizations:  Again declined ALL -Female care:  Pap with HPV neg 01/2016 Normal DEXA 02/2016 Breast pain, seen 02-2018 : breast exam- mammogram bilaterally were negative.  Patient reports no symptoms at this point -CCS: (-)  IFOB 03-2016; previous cologuard referral failed, declines cscope, we agreed on iFOB -Diet and  Exercise discussed.  Last cholesterol was a slightly elevated, she is trying to do better with diet - labs: BMP, FLP

## 2018-06-19 NOTE — Progress Notes (Signed)
Subjective:    Patient ID: Teresa Burch, female    DOB: 1951-01-07, 68 y.o.   MRN: [id number]  DOS:  06/19/2018 Type of visit - description: cpx No major concerns  Review of Systems  A 14 point review of systems is negative    Past Medical History:  Diagnosis Date  . Hypertension     Past Surgical History:  Procedure Laterality Date  . BREAST BIOPSY Left 03/2016   (-)  . BTL     1980s  . HERNIA REPAIR  ~2005    Social History   Socioeconomic History  . Marital status: Legally Separated    Spouse name: Not on file  . Number of children: 3  . Years of education: Not on file  . Highest education level: Not on file  Occupational History  . Occupation: retired, [employer]  Social Needs  . Financial resource strain: Not on file  . Food insecurity:    Worry: Not on file    Inability: Not on file  . Transportation needs:    Medical: Not on file    Non-medical: Not on file  Tobacco Use  . Smoking status: Never Smoker  . Smokeless tobacco: Never Used  Substance and Sexual Activity  . Alcohol use: No  . Drug use: No  . Sexual activity: Not Currently  Lifestyle  . Physical activity:    Days per week: Not on file    Minutes per session: Not on file  . Stress: Not on file  Relationships  . Social connections:    Talks on phone: Not on file    Gets together: Not on file    Attends religious service: Not on file    Active member of club or organization: Not on file    Attends meetings of clubs or organizations: Not on file    Relationship status: Not on file  . Intimate partner violence:    Fear of current or ex partner: Not on file    Emotionally abused: Not on file    Physically abused: Not on file    Forced sexual activity: Not on file  Other Topics Concern  . Not on file  Social History Narrative   From [place]   Moved to [place] 1967   Lives by herself    3 daughters, 2 daughters in [place] , [place] in [place]     Family History  Problem  Relation Age of Onset  . CAD Father        MI age 44  . Lung cancer Sister 21       LUNG   . Breast cancer Sister   . Diabetes Neg Hx   . Colon cancer Neg Hx      Allergies as of 06/19/2018      Reactions   Aspirin Other (See Comments)   GI upset      Medication List       Accurate as of June 19, 2018 11:59 PM. Always use your most recent med list.        losartan-hydrochlorothiazide 100-25 MG tablet Commonly known as:  HYZAAR Take 1 tablet by mouth daily.   PROBIOTIC DAILY PO Take 1 tablet by mouth daily.           Objective:   Physical Exam BP 132/80 (BP Location: Left Arm, Patient Position: Sitting, Cuff Size: Small)   Pulse 66   Temp 98 F (36.7 C) (Oral)   Resp 16   Ht 5'  3" (1.6 m)   Wt 168 lb 2 oz (76.3 kg)   SpO2 93%   BMI 29.78 kg/m  General: Well developed, NAD, BMI noted Neck: No  thyromegaly  HEENT:  Normocephalic . Face symmetric, atraumatic Lungs:  CTA B Normal respiratory effort, no intercostal retractions, no accessory muscle use. Heart: RRR,  no murmur.  No pretibial edema bilaterally  Abdomen:  Not distended, soft, non-tender. No rebound or rigidity.   Skin: Exposed areas without rash. Not pale. Not jaundice Neurologic:  alert & oriented X3.  Speech normal, gait appropriate for age and unassisted Strength symmetric and appropriate for age.  Psych: Cognition and judgment appear intact.  Cooperative with normal attention span and concentration.  Behavior appropriate. No anxious or depressed appearing.     Assessment     Assessment  (new pt 12-2015, previous PCP dr Hilma Favors, @ [place], [place]) HTN - dx ~ 2015 L Breast biopsy (-) 03/2016  PLAN: HTN: Good compliance with losartan HCT, she remains concerned about the recall of losartan, she is reassured, her pharmacist will not provide her with a contaminated pill. No recent ambulatory BPs.  BP today is very good.  No change Mild hyperlipidemia: See last FLP, patient is  trying to do better with diet and exercise but she will remain reluctant to take medications unless her cholesterol is much higher. RTC 6 to 8 months

## 2018-06-19 NOTE — Progress Notes (Signed)
Pre visit review using our clinic review tool, if applicable. No additional management support is needed unless otherwise documented below in the visit note. 

## 2018-06-20 NOTE — Assessment & Plan Note (Signed)
HTN: Good compliance with losartan HCT, she remains concerned about the recall of losartan, she is reassured, her pharmacist will not provide her with a contaminated pill. No recent ambulatory BPs.  BP today is very good.  No change Mild hyperlipidemia: See last FLP, patient is trying to do better with diet and exercise but she will remain reluctant to take medications unless her cholesterol is much higher. RTC 6 to 8 months

## 2018-12-27 ENCOUNTER — Other Ambulatory Visit: Payer: Self-pay | Admitting: Internal Medicine

## 2019-02-22 ENCOUNTER — Ambulatory Visit (INDEPENDENT_AMBULATORY_CARE_PROVIDER_SITE_OTHER): Payer: Medicare HMO | Admitting: Internal Medicine

## 2019-02-22 ENCOUNTER — Encounter: Payer: Self-pay | Admitting: Internal Medicine

## 2019-02-22 ENCOUNTER — Other Ambulatory Visit: Payer: Self-pay

## 2019-02-22 VITALS — BP 135/85 | HR 72 | Temp 95.7°F | Resp 16 | Ht 63.0 in | Wt 162.0 lb

## 2019-02-22 DIAGNOSIS — I10 Essential (primary) hypertension: Secondary | ICD-10-CM

## 2019-02-22 DIAGNOSIS — E785 Hyperlipidemia, unspecified: Secondary | ICD-10-CM

## 2019-02-22 LAB — LIPID PANEL
Cholesterol: 234 mg/dL — ABNORMAL HIGH (ref 0–200)
HDL: 54.6 mg/dL (ref >39.00)
LDL Cholesterol: 160 mg/dL — ABNORMAL HIGH (ref 0–99)
NonHDL: 179.84
Total CHOL/HDL Ratio: 4
Triglycerides: 99 mg/dL (ref 0.0–149.0)
VLDL: 19.8 mg/dL (ref 0.0–40.0)

## 2019-02-22 LAB — COMPREHENSIVE METABOLIC PANEL
ALT: 15 U/L (ref 0–35)
AST: 18 U/L (ref 0–37)
Albumin: 4.5 g/dL (ref 3.5–5.2)
Alkaline Phosphatase: 66 U/L (ref 39–117)
BUN: 12 mg/dL (ref 6–23)
CO2: 33 mEq/L — ABNORMAL HIGH (ref 19–32)
Calcium: 10 mg/dL (ref 8.4–10.5)
Chloride: 100 mEq/L (ref 96–112)
Creatinine, Ser: 0.73 mg/dL (ref 0.40–1.20)
GFR: 78.67 mL/min (ref >60.00)
Glucose, Bld: 92 mg/dL (ref 70–99)
Potassium: 4.4 mEq/L (ref 3.5–5.1)
Sodium: 138 mEq/L (ref 135–145)
Total Bilirubin: 0.7 mg/dL (ref 0.2–1.2)
Total Protein: 7.2 g/dL (ref 6.0–8.3)

## 2019-02-22 NOTE — Patient Instructions (Addendum)
Please schedule Medicare Wellness with Glenard Haring.   GO TO THE LAB : Get the blood work     GO TO THE FRONT DESK Schedule your next appointment for physical exam in 6 months   Check the  blood pressure 2   times a month   BP GOAL is between 110/65 and  135/85. If it is consistently higher or lower, let me know

## 2019-02-22 NOTE — Progress Notes (Signed)
Subjective:    Patient ID: Teresa Burch, female    DOB: Jan 23, 1951, 68 y.o.   MRN: [id number]  DOS:  02/22/2019 Type of visit - description: Routine office visit HTN: Check BPs at home sporadically, gets different readings.  Does not recall any specific number.  Good med compliance High cholesterol: Diet controlled, likes her cholesterol to be checked History of toenail fungal infection, treatment?  Denies pain or discomfort.  The nail has not changed in years   Review of Systems No chest pain no difficulty breathing No nausea, vomiting, diarrhea  Past Medical History:  Diagnosis Date  . Hypertension     Past Surgical History:  Procedure Laterality Date  . BREAST BIOPSY Left 03/2016   (-)  . BTL     1980s  . HERNIA REPAIR  ~2005    Social History   Socioeconomic History  . Marital status: Legally Separated    Spouse name: Not on file  . Number of children: 3  . Years of education: Not on file  . Highest education level: Not on file  Occupational History  . Occupation: retired, [employer]  Social Needs  . Financial resource strain: Not on file  . Food insecurity    Worry: Not on file    Inability: Not on file  . Transportation needs    Medical: Not on file    Non-medical: Not on file  Tobacco Use  . Smoking status: Never Smoker  . Smokeless tobacco: Never Used  Substance and Sexual Activity  . Alcohol use: No  . Drug use: No  . Sexual activity: Not Currently  Lifestyle  . Physical activity    Days per week: Not on file    Minutes per session: Not on file  . Stress: Not on file  Relationships  . Social Musician on phone: Not on file    Gets together: Not on file    Attends religious service: Not on file    Active member of club or organization: Not on file    Attends meetings of clubs or organizations: Not on file    Relationship status: Not on file  . Intimate partner violence    Fear of current or ex partner: Not on file    Emotionally  abused: Not on file    Physically abused: Not on file    Forced sexual activity: Not on file  Other Topics Concern  . Not on file  Social History Narrative   From [place]   Moved to [place] 1967   Lives by herself    3 daughters, 2 daughters in [place] , [place] in [place]      Allergies as of 02/22/2019      Reactions   Aspirin Other (See Comments)   GI upset      Medication List       Accurate as of February 22, 2019 10:15 AM. If you have any questions, ask your nurse or doctor.        losartan-hydrochlorothiazide 100-25 MG tablet Commonly known as: HYZAAR Take 1 tablet by mouth daily.   PROBIOTIC DAILY PO Take 1 tablet by mouth daily.           Objective:   Physical Exam BP (!) 157/87 (BP Location: Left Arm, Patient Position: Sitting, Cuff Size: Small)   Pulse 72   Temp (!) 95.7 F (35.4 C) (Temporal)   Resp 16   Ht 5\' 3"  (1.6 m)   Wt  162 lb (73.5 kg)   SpO2 95%   BMI 28.70 kg/m  General:   Well developed, NAD, BMI noted. HEENT:  Normocephalic . Face symmetric, atraumatic Lungs:  CTA B Normal respiratory effort, no intercostal retractions, no accessory muscle use. Heart: RRR,  no murmur.  No pretibial edema bilaterally  Skin: Left great toe nail is dystrophic,  other nails normal Neurologic:  alert & oriented X3.  Speech normal, gait appropriate for age and unassisted Psych--  Cognition and judgment appear intact.  Cooperative with normal attention span and concentration.  Behavior appropriate. No anxious or depressed appearing.      Assessment    Assessment  (new pt 12-2015, previous PCP dr Flonnie Overman, @ [place], [place]) HTN - dx ~ 2015 High cholesterol  L Breast biopsy (-) 03/2016  PLAN: HTN: Encouraged to do ambulatory BPs, otherwise continue Hyzaar.  Checking a CMP. Dyslipidemia: Diet controlled, likes FLP to be checked.  Will do.  At the same time she told me that she will be extremely reluctant to take medication unless the  cholesterol is "very high".  Current  10-year cardiovascular risk: 19.1% Preventive care strongly declined a flu shot, benefits discussed. RTC CPX 6 months

## 2019-02-22 NOTE — Progress Notes (Signed)
Pre visit review using our clinic review tool, if applicable. No additional management support is needed unless otherwise documented below in the visit note. 

## 2019-02-23 NOTE — Assessment & Plan Note (Signed)
HTN: Encouraged to do ambulatory BPs, otherwise continue Hyzaar.  Checking a CMP. Dyslipidemia: Diet controlled, likes FLP to be checked.  Will do.  At the same time she told me that she will be extremely reluctant to take medication unless the cholesterol is "very high".  Current  10-year cardiovascular risk: 19.1% Preventive care strongly declined a flu shot, benefits discussed. RTC CPX 6 months

## 2019-06-17 ENCOUNTER — Other Ambulatory Visit: Payer: Self-pay | Admitting: Internal Medicine

## 2019-08-23 ENCOUNTER — Other Ambulatory Visit: Payer: Self-pay

## 2019-08-23 ENCOUNTER — Ambulatory Visit (INDEPENDENT_AMBULATORY_CARE_PROVIDER_SITE_OTHER): Payer: Medicare HMO | Admitting: Internal Medicine

## 2019-08-23 ENCOUNTER — Encounter: Payer: Self-pay | Admitting: Internal Medicine

## 2019-08-23 VITALS — BP 165/85 | HR 76 | Temp 96.2°F | Resp 18 | Ht 63.0 in | Wt 160.4 lb

## 2019-08-23 DIAGNOSIS — E785 Hyperlipidemia, unspecified: Secondary | ICD-10-CM | POA: Diagnosis not present

## 2019-08-23 DIAGNOSIS — Z01419 Encounter for gynecological examination (general) (routine) without abnormal findings: Secondary | ICD-10-CM

## 2019-08-23 DIAGNOSIS — I10 Essential (primary) hypertension: Secondary | ICD-10-CM

## 2019-08-23 DIAGNOSIS — Z803 Family history of malignant neoplasm of breast: Secondary | ICD-10-CM

## 2019-08-23 DIAGNOSIS — Z0000 Encounter for general adult medical examination without abnormal findings: Secondary | ICD-10-CM | POA: Diagnosis not present

## 2019-08-23 DIAGNOSIS — R011 Cardiac murmur, unspecified: Secondary | ICD-10-CM | POA: Diagnosis not present

## 2019-08-23 DIAGNOSIS — Z1231 Encounter for screening mammogram for malignant neoplasm of breast: Secondary | ICD-10-CM

## 2019-08-23 LAB — LIPID PANEL
Cholesterol: 217 mg/dL — ABNORMAL HIGH (ref 0–200)
HDL: 54.9 mg/dL (ref >39.00)
LDL Cholesterol: 142 mg/dL — ABNORMAL HIGH (ref 0–99)
NonHDL: 161.82
Total CHOL/HDL Ratio: 4
Triglycerides: 97 mg/dL (ref 0.0–149.0)
VLDL: 19.4 mg/dL (ref 0.0–40.0)

## 2019-08-23 LAB — CBC WITH DIFFERENTIAL/PLATELET
Basophils Absolute: 0.1 10*3/uL (ref 0.0–0.1)
Basophils Relative: 1.2 % (ref 0.0–3.0)
Eosinophils Absolute: 0.3 10*3/uL (ref 0.0–0.7)
Eosinophils Relative: 4.4 % (ref 0.0–5.0)
HCT: 39.7 % (ref 36.0–46.0)
Hemoglobin: 13.2 g/dL (ref 12.0–15.0)
Lymphocytes Relative: 27.8 % (ref 12.0–46.0)
Lymphs Abs: 1.8 10*3/uL (ref 0.7–4.0)
MCHC: 33.2 g/dL (ref 30.0–36.0)
MCV: 89.5 fl (ref 78.0–100.0)
Monocytes Absolute: 0.5 10*3/uL (ref 0.1–1.0)
Monocytes Relative: 8 % (ref 3.0–12.0)
Neutro Abs: 3.8 10*3/uL (ref 1.4–7.7)
Neutrophils Relative %: 58.6 % (ref 43.0–77.0)
Platelets: 208 10*3/uL (ref 150.0–400.0)
RBC: 4.44 Mil/uL (ref 3.87–5.11)
RDW: 14.1 % (ref 11.5–15.5)
WBC: 6.5 10*3/uL (ref 4.0–10.5)

## 2019-08-23 LAB — COMPREHENSIVE METABOLIC PANEL
ALT: 16 U/L (ref 0–35)
AST: 18 U/L (ref 0–37)
Albumin: 4.2 g/dL (ref 3.5–5.2)
Alkaline Phosphatase: 69 U/L (ref 39–117)
BUN: 11 mg/dL (ref 6–23)
CO2: 34 mEq/L — ABNORMAL HIGH (ref 19–32)
Calcium: 9.6 mg/dL (ref 8.4–10.5)
Chloride: 100 mEq/L (ref 96–112)
Creatinine, Ser: 0.76 mg/dL (ref 0.40–1.20)
GFR: 74.99 mL/min (ref >60.00)
Glucose, Bld: 96 mg/dL (ref 70–99)
Potassium: 4.2 mEq/L (ref 3.5–5.1)
Sodium: 139 mEq/L (ref 135–145)
Total Bilirubin: 0.6 mg/dL (ref 0.2–1.2)
Total Protein: 6.7 g/dL (ref 6.0–8.3)

## 2019-08-23 LAB — TSH: TSH: 1.44 u[IU]/mL (ref 0.35–4.50)

## 2019-08-23 NOTE — Assessment & Plan Note (Signed)
Here for CPX HTN: Good compliance with Hyzaar.  BP today slightly elevated, typically okay at home. EKG today: NSR, unchanged from previous No change, recommend ambulatory BPs.  Labs. High cholesterol: Diet controlled, checking labs Heart murmur: First noted today, no symptoms, EKG done today: NSR, unchanged from previous.  Get a echo. RTC 4 months

## 2019-08-23 NOTE — Patient Instructions (Addendum)
Please schedule Medicare Wellness with Lawanna Kobus.   Check your blood pressure twice a month BP GOAL is between 110/65 and  135/85. If it is consistently higher or lower, let me know  We will refer you to Dr. Lily Peer office.  We will schedule an echocardiogram to check your heart  GO TO THE LAB : Get the blood work     GO TO THE FRONT DESK, PLEASE SCHEDULE YOUR APPOINTMENTS Come back for for a checkup in 4 months

## 2019-08-23 NOTE — Assessment & Plan Note (Signed)
-  Immunizations:  Again declined ALL except covid shots (had two) -Female care:  Pap with HPV neg 01/2016 Normal DEXA 02/2016 MMG last 02-2018  Refer to gynecology, schedule mammogram -CCS: (-)  IFOB 03-2016; previous cologuard and last IFOB failed; declines cscope; plan: IFOB -Diet and  Exercise discussed.  Last cholesterol was a slightly elevated, she is trying to do better with diet - labs:  CMP, FLP, CBC, TSH

## 2019-08-23 NOTE — Progress Notes (Signed)
   Subjective:    Patient ID: Teresa Burch, female    DOB: [birth year], 69 y.o.   MRN: [id number]  DOS:  08/23/2019 Type of visit - description: CPX In general no concerns.  BP Readings from Last 3 Encounters:  08/23/19 (!) 165/85  02/22/19 135/85  06/19/18 132/80     Review of Systems  A 14 point review of systems is negative    Past Medical History:  Diagnosis Date  . Hypertension     Past Surgical History:  Procedure Laterality Date  . BREAST BIOPSY Left 03/2016   (-)  . BTL     1980s  . HERNIA REPAIR  ~2005   Family History  Problem Relation Age of Onset  . CAD Father        MI age 18  . Lung cancer Sister 84       LUNG   . Breast cancer Sister   . Diabetes Neg Hx   . Colon cancer Neg Hx     Allergies as of 08/23/2019      Reactions   Aspirin Other (See Comments)   GI upset      Medication List       Accurate as of Aug 23, 2019  3:34 PM. If you have any questions, ask your nurse or doctor.        losartan-hydrochlorothiazide 100-25 MG tablet Commonly known as: HYZAAR Take 1 tablet by mouth daily.   PROBIOTIC DAILY PO Take 1 tablet by mouth daily.          Objective:   Physical Exam BP (!) 165/85 (BP Location: Right Arm, Patient Position: Sitting, Cuff Size: Small)   Pulse 76   Temp (!) 96.2 F (35.7 C) (Temporal)   Resp 18   Ht 5\' 3"  (1.6 m)   Wt 160 lb 6 oz (72.7 kg)   SpO2 100%   BMI 28.41 kg/m  General: Well developed, NAD, BMI noted Neck: No  thyromegaly  HEENT:  Normocephalic . Face symmetric, atraumatic Lungs:  CTA B Normal respiratory effort, no intercostal retractions, no accessory muscle use. Heart: RRR, soft systolic murmur, mostly at the right side of the sternum, increase S2? Abdomen:  Not distended, soft, non-tender. No rebound or rigidity.   Lower extremities: no pretibial edema bilaterally  Skin: Exposed areas without rash. Not pale. Not jaundice Neurologic:  alert & oriented X3.  Speech normal, gait  appropriate for age and unassisted Strength symmetric and appropriate for age.  Psych: Cognition and judgment appear intact.  Cooperative with normal attention span and concentration.  Behavior appropriate. No anxious or depressed appearing.     Assessment     Assessment  (new pt 12-2015, previous PCP dr 03-04-2003, @ [place], [place]) HTN - dx ~ 2015 High cholesterol  L Breast biopsy (-) 03/2016  PLAN: Here for CPX HTN: Good compliance with Hyzaar.  BP today slightly elevated, typically okay at home. EKG today: NSR, unchanged from previous No change, recommend ambulatory BPs.  Labs. High cholesterol: Diet controlled, checking labs Heart murmur: First noted today, no symptoms, EKG done today: NSR, unchanged from previous.  Get a echo. RTC 4 months    This visit occurred during the SARS-CoV-2 public health emergency.  Safety protocols were in place, including screening questions prior to the visit, additional usage of staff PPE, and extensive cleaning of exam room while observing appropriate contact time as indicated for disinfecting solutions.

## 2019-08-23 NOTE — Progress Notes (Signed)
mPre visit review using our clinic review tool, if applicable. No additional management support is needed unless otherwise documented below in the visit note. 

## 2019-08-25 ENCOUNTER — Encounter: Payer: Self-pay | Admitting: Obstetrics and Gynecology

## 2019-09-01 ENCOUNTER — Other Ambulatory Visit: Payer: Self-pay | Admitting: Internal Medicine

## 2019-09-14 ENCOUNTER — Ambulatory Visit: Payer: Medicare HMO | Admitting: Obstetrics and Gynecology

## 2019-09-14 ENCOUNTER — Other Ambulatory Visit (INDEPENDENT_AMBULATORY_CARE_PROVIDER_SITE_OTHER): Payer: Medicare HMO

## 2019-09-14 ENCOUNTER — Other Ambulatory Visit (HOSPITAL_COMMUNITY): Payer: Medicare HMO

## 2019-09-14 DIAGNOSIS — Z0000 Encounter for general adult medical examination without abnormal findings: Secondary | ICD-10-CM | POA: Diagnosis not present

## 2019-09-14 LAB — FECAL OCCULT BLOOD, IMMUNOCHEMICAL: Fecal Occult Bld: NEGATIVE

## 2019-09-22 ENCOUNTER — Encounter (HOSPITAL_COMMUNITY): Payer: Self-pay | Admitting: Internal Medicine

## 2019-10-04 ENCOUNTER — Encounter (HOSPITAL_COMMUNITY): Payer: Self-pay | Admitting: Internal Medicine

## 2019-10-14 ENCOUNTER — Telehealth (HOSPITAL_COMMUNITY): Payer: Self-pay | Admitting: Internal Medicine

## 2019-10-14 NOTE — Telephone Encounter (Signed)
Noted, thank you

## 2019-10-14 NOTE — Telephone Encounter (Signed)
Just an FYI. We have made several attempts to contact this patient including sending a letter to schedule or reschedule their echocardiogram. We will be removing the patient from the echo WQ.   09/22/19 Mailed letter/[facility]  09/15/19 @[facility]  [facility] to schedule echo EVD  09/14/19 patient NS for echo EVD  auth # [id number] valid 08/23/19-09/22/19 @ [address]        Thank you

## 2019-11-03 ENCOUNTER — Encounter: Payer: Self-pay | Admitting: Internal Medicine

## 2019-11-24 DIAGNOSIS — Z20822 Contact with and (suspected) exposure to covid-19: Secondary | ICD-10-CM | POA: Diagnosis not present

## 2019-12-09 ENCOUNTER — Other Ambulatory Visit: Payer: Self-pay | Admitting: Internal Medicine

## 2019-12-24 ENCOUNTER — Ambulatory Visit: Payer: Medicare HMO | Admitting: Internal Medicine

## 2020-01-04 ENCOUNTER — Other Ambulatory Visit: Payer: Self-pay | Admitting: Internal Medicine

## 2020-01-04 DIAGNOSIS — Z0000 Encounter for general adult medical examination without abnormal findings: Secondary | ICD-10-CM

## 2020-01-24 ENCOUNTER — Inpatient Hospital Stay: Admission: RE | Admit: 2020-01-24 | Payer: Medicare HMO | Source: Ambulatory Visit

## 2020-02-29 ENCOUNTER — Ambulatory Visit: Payer: Medicare HMO

## 2020-03-03 ENCOUNTER — Other Ambulatory Visit: Payer: Self-pay

## 2020-03-03 ENCOUNTER — Ambulatory Visit
Admission: RE | Admit: 2020-03-03 | Discharge: 2020-03-03 | Disposition: A | Discharge status: Home / Self Care | Payer: Medicare HMO | Source: Ambulatory Visit | Attending: Internal Medicine | Admitting: Internal Medicine

## 2020-03-03 DIAGNOSIS — Z1231 Encounter for screening mammogram for malignant neoplasm of breast: Secondary | ICD-10-CM | POA: Diagnosis not present

## 2020-03-03 DIAGNOSIS — Z0000 Encounter for general adult medical examination without abnormal findings: Secondary | ICD-10-CM

## 2020-03-28 ENCOUNTER — Encounter: Payer: Self-pay | Admitting: Obstetrics and Gynecology

## 2020-03-28 ENCOUNTER — Other Ambulatory Visit: Payer: Self-pay

## 2020-03-28 ENCOUNTER — Ambulatory Visit (INDEPENDENT_AMBULATORY_CARE_PROVIDER_SITE_OTHER): Payer: Medicare HMO | Admitting: Obstetrics and Gynecology

## 2020-03-28 VITALS — BP 124/70 | Ht 62.5 in | Wt 159.0 lb

## 2020-03-28 DIAGNOSIS — Z01419 Encounter for gynecological examination (general) (routine) without abnormal findings: Secondary | ICD-10-CM | POA: Diagnosis not present

## 2020-03-28 NOTE — Progress Notes (Signed)
   Teresa Burch Jun 18, 1950 [id number]  SUBJECTIVE:  69 y.o. [id number] female here for a breast and pelvic exam. She has no gynecologic concerns.  Daughter present to help with some language interpretation.  Current Outpatient Medications  Medication Sig Dispense Refill  . losartan-hydrochlorothiazide (HYZAAR) 100-25 MG tablet Take 1 tablet by mouth daily. 90 tablet 1  . Probiotic Product (PROBIOTIC DAILY PO) Take 1 tablet by mouth daily.     No current facility-administered medications for this visit.   Allergies: Aspirin  No LMP recorded. Patient is postmenopausal.  Past medical history,surgical history, problem list, medications, allergies, family history and social history were all reviewed and documented as reviewed in the EPIC chart.  GYN ROS: no abnormal bleeding, pelvic pain or discharge, no breast pain or new or enlarging lumps on self exam.  No dysuria, urinary frequency, pain with urination, cloudy/malodorous urine.   OBJECTIVE:  BP 124/70   Ht 5' 2.5" (1.588 m)   Wt 159 lb (72.1 kg)   BMI 28.62 kg/m  The patient appears well, alert, oriented, in no distress.  BREAST EXAM: breasts appear normal, no suspicious masses, no skin or nipple changes or axillary nodes  PELVIC EXAM: VULVA: normal appearing vulva with atrophic change, no masses, tenderness or lesions, VAGINA: normal appearing vagina with atrophic change, normal color and discharge, no lesions, CERVIX: normal appearing atrophic cervix without discharge or lesions, UTERUS: uterus is normal size, shape, consistency and nontender, ADNEXA: normal adnexa in size, nontender and no masses  Chaperone: Teresa Burch present during the examination  ASSESSMENT:  69 y.o. [id number] here for a breast and pelvic exam  PLAN:   1. Postmenopausal.  No significant hot flashes or night sweats.  No vaginal bleeding. 2. Pap smear/HPV 01/2016.  She reports no significant history of abnormal Pap smears.  Discussed the current screening  guidelines and based on age and normal Pap smear history, after age 38 she does not necessarily need to continue with cervical cancer screening and she agrees with not continuing screening based on our discussion today. 3. Mammogram 02/2020.  Normal breast exam today.  She is reminded to schedule an annual mammogram this year when due. 4. Colonoscopy never.  I discussed the importance of colon cancer screening for early detection when it is more treatable.  If she does not want to do a colonoscopy then I recommend she speak with her primary doctor regarding the Cologuard test.  She acknowledges my recommendations. 5. DEXA 02/2016.  Normal.  Next DEXA recommended 2022 after next annual appointment.  Reviewed importance of vitamin D and calcium and weightbearing exercise to maintain bone health.  6. Health maintenance.  No labs today as she normally has these completed elsewhere.  Return annually or sooner, prn.  [name] MD 03/28/20

## 2020-04-03 ENCOUNTER — Ambulatory Visit (INDEPENDENT_AMBULATORY_CARE_PROVIDER_SITE_OTHER): Payer: Medicare HMO | Admitting: Internal Medicine

## 2020-04-03 ENCOUNTER — Other Ambulatory Visit: Payer: Self-pay

## 2020-04-03 ENCOUNTER — Encounter: Payer: Self-pay | Admitting: Internal Medicine

## 2020-04-03 VITALS — BP 157/98 | HR 86 | Temp 98.2°F | Resp 16 | Ht 63.0 in | Wt 155.5 lb

## 2020-04-03 DIAGNOSIS — E7800 Pure hypercholesterolemia, unspecified: Secondary | ICD-10-CM

## 2020-04-03 DIAGNOSIS — I10 Essential (primary) hypertension: Secondary | ICD-10-CM

## 2020-04-03 LAB — BASIC METABOLIC PANEL
BUN: 11 mg/dL (ref 6–23)
CO2: 32 mEq/L (ref 19–32)
Calcium: 9.5 mg/dL (ref 8.4–10.5)
Chloride: 100 mEq/L (ref 96–112)
Creatinine, Ser: 0.78 mg/dL (ref 0.40–1.20)
GFR: 76.24 mL/min (ref >60.00)
Glucose, Bld: 91 mg/dL (ref 70–99)
Potassium: 3.7 mEq/L (ref 3.5–5.1)
Sodium: 138 mEq/L (ref 135–145)

## 2020-04-03 NOTE — Patient Instructions (Addendum)
Your blood pressure is a slightly high today. Please check your blood pressure weekly: BP GOAL is between 110/65 and  135/85. If it is consistently higher or lower, let me know  GO TO THE LAB : Get the blood work     GO TO THE FRONT DESK, PLEASE SCHEDULE YOUR APPOINTMENTS Come back for a physical exam in 6 months

## 2020-04-03 NOTE — Progress Notes (Signed)
   Subjective:    Patient ID: Teresa Burch, female    DOB: Jan 03, 1951, 69 y.o.   MRN: [id number]  DOS:  04/03/2020 Type of visit - description: Follow-up Since the last office visit she is feeling well. Somewhat distressed about the health of her sister. BP today slightly elevated, was normal at gynecology few days ago and normal when she checks.  BP Readings from Last 3 Encounters:  04/03/20 (!) 157/98  03/28/20 124/70  08/23/19 (!) 165/85     Review of Systems Denies chest pain no difficulty breathing No cough or sputum production Past Medical History:  Diagnosis Date  . Hypertension     Past Surgical History:  Procedure Laterality Date  . BREAST BIOPSY Left 03/2016   (-)  . BTL     1980s  . HERNIA REPAIR  ~2005    Allergies as of 04/03/2020      Reactions   Aspirin Other (See Comments)   GI upset      Medication List       Accurate as of April 03, 2020 11:07 AM. If you have any questions, ask your nurse or doctor.        losartan-hydrochlorothiazide 100-25 MG tablet Commonly known as: HYZAAR Take 1 tablet by mouth daily.   PROBIOTIC DAILY PO Take 1 tablet by mouth daily.          Objective:   Physical Exam BP (!) 157/98 (BP Location: Left Arm, Patient Position: Sitting, Cuff Size: Small)   Pulse 86   Temp 98.2 F (36.8 C) (Oral)   Resp 16   Ht 5\' 3"  (1.6 m)   Wt 155 lb 8 oz (70.5 kg)   SpO2 100%   BMI 27.55 kg/m  General:   Well developed, NAD, BMI noted. HEENT:  Normocephalic . Face symmetric, atraumatic Lungs:  CTA B Normal respiratory effort, no intercostal retractions, no accessory muscle use. Heart: RRR,  no murmur.  Lower extremities: no pretibial edema bilaterally  Skin: Not pale. Not jaundice Neurologic:  alert & oriented X3.  Speech normal, gait appropriate for age and unassisted Psych--  Cognition and judgment appear intact.  Cooperative with normal attention span and concentration.  Behavior appropriate. No  anxious or depressed appearing.      Assessment      Assessment  (new pt 12-2015, previous PCP dr 03-04-2003, @ [facility], [place]) HTN - dx ~ 2015 High cholesterol : declines statins consistently L Breast biopsy (-) 03/2016  PLAN: HTN: On Hyzaar,BP today slightly elevated, normal at home, normal at the gynecology office.  Recommend no change, check BMP, monitor BPs at home, see AVS. Heart murmur: Noted at the last visit, no murmur appreciated today, patient did not pursue echo. High cholesterol: Last labs 08/2019 she would qualify for statins.  Declines. Preventive care:  Saw gynecology 03/28/2020 S/p  Covid vaccines x 2. Plans to get a booster Declined all other shots RTC 6 months CPX   This visit occurred during the SARS-CoV-2 public health emergency.  Safety protocols were in place, including screening questions prior to the visit, additional usage of staff PPE, and extensive cleaning of exam room while observing appropriate contact time as indicated for disinfecting solutions.

## 2020-04-03 NOTE — Progress Notes (Signed)
Pre visit review using our clinic review tool, if applicable. No additional management support is needed unless otherwise documented below in the visit note. 

## 2020-04-04 DIAGNOSIS — E7800 Pure hypercholesterolemia, unspecified: Secondary | ICD-10-CM | POA: Insufficient documentation

## 2020-04-04 NOTE — Assessment & Plan Note (Signed)
HTN: On Hyzaar,BP today slightly elevated, normal at home, normal at the gynecology office.  Recommend no change, check BMP, monitor BPs at home, see AVS. Heart murmur: Noted at the last visit, no murmur appreciated today, patient did not pursue echo. High cholesterol: Last labs 08/2019 she would qualify for statins.  Declines. Preventive care:  Saw gynecology 03/28/2020 S/p  Covid vaccines x 2. Plans to get a booster Declined all other shots RTC 6 months CPX

## 2020-06-13 ENCOUNTER — Other Ambulatory Visit: Payer: Self-pay | Admitting: Internal Medicine

## 2020-10-02 ENCOUNTER — Encounter: Payer: Medicare HMO | Admitting: Internal Medicine

## 2020-12-01 ENCOUNTER — Telehealth: Payer: Self-pay | Admitting: Internal Medicine

## 2020-12-01 MED ORDER — LOSARTAN POTASSIUM-HCTZ 100-25 MG PO TABS: 1.0000 | ORAL_TABLET | Freq: Every day | ORAL | 0 refills | Status: DC

## 2020-12-01 NOTE — Telephone Encounter (Signed)
Pt states is requesting refill on losartan-hydrochlorothiazide (HYZAAR) 100-25 MG tablet  since day of refill is coming up, send to [id number] Lorenza Evangelist, [place] - [address] AT [facility] [address]  [address], [place] [address]  Phone:  [phone]  Fax:  [phone]  DEA #:  [id number].  Please advise.

## 2020-12-01 NOTE — Telephone Encounter (Signed)
30 day supply sent to [facility]. Pt is overdue for her CPX. Please schedule within the next 30 days please. Thank you.

## 2020-12-05 NOTE — Telephone Encounter (Signed)
LVM for pt to call office and schedule CPE (pt is due for cpe) per Provider.

## 2020-12-06 ENCOUNTER — Other Ambulatory Visit: Payer: Self-pay | Admitting: Internal Medicine

## 2020-12-07 ENCOUNTER — Other Ambulatory Visit: Payer: Self-pay | Admitting: Internal Medicine

## 2020-12-07 MED ORDER — LOSARTAN POTASSIUM-HCTZ 100-25 MG PO TABS: 1.0000 | ORAL_TABLET | Freq: Every day | ORAL | 0 refills | Status: DC

## 2020-12-07 NOTE — Telephone Encounter (Signed)
Rx resent.

## 2020-12-07 NOTE — Telephone Encounter (Signed)
Daughter called today and scheduled a CPE for 01/04/2021. She was unaware that RX was sent in on the 12th so it was restocked. Please re-issue a 30 ds as pt is leaving the country for 2 weeks because her sister has passed away.

## 2021-01-03 ENCOUNTER — Other Ambulatory Visit: Payer: Self-pay | Admitting: Internal Medicine

## 2021-01-04 ENCOUNTER — Encounter: Payer: Medicare HMO | Admitting: Internal Medicine

## 2021-01-22 ENCOUNTER — Telehealth: Payer: Self-pay | Admitting: Internal Medicine

## 2021-01-22 NOTE — Telephone Encounter (Signed)
Last OV 03/2020- she was supposed to return in June 2022 for physical exam, she cancelled her visit on 10/02/20. She called for refills and 30 days was sent on 12/01/2020 and an appointment was made for 01/04/21 because she had a family death and was going out of the country. She then cancelled her visit for 01/04/21- needs to be sooner than 02/09/21 for further refills.

## 2021-01-22 NOTE — Telephone Encounter (Signed)
Medication: losartan-hydrochlorothiazide (HYZAAR) 100-25 MG tablet   Has the patient contacted their pharmacy? No. (If no, request that the patient contact the pharmacy for the refill.) (If yes, when and what did the pharmacy advise?)  Preferred Pharmacy (with phone number or street name): [id number] Lorenza Evangelist, [place] - [address] AT [facility] [address]  [address], [place] [address]  Phone:  [phone]  Fax:  [phone]  Agent: Please be advised that RX refills may take up to 3 business days. We ask that you follow-up with your pharmacy.

## 2021-01-24 ENCOUNTER — Other Ambulatory Visit: Payer: Self-pay | Admitting: Internal Medicine

## 2021-01-24 MED ORDER — LOSARTAN POTASSIUM-HCTZ 100-25 MG PO TABS: 1.0000 | ORAL_TABLET | Freq: Every day | ORAL | 0 refills | Status: DC

## 2021-01-24 NOTE — Telephone Encounter (Signed)
30 day supply sent

## 2021-02-09 ENCOUNTER — Ambulatory Visit (INDEPENDENT_AMBULATORY_CARE_PROVIDER_SITE_OTHER): Payer: Medicare HMO | Admitting: Internal Medicine

## 2021-02-09 ENCOUNTER — Other Ambulatory Visit: Payer: Self-pay

## 2021-02-09 ENCOUNTER — Encounter: Payer: Self-pay | Admitting: Internal Medicine

## 2021-02-09 VITALS — BP 138/86 | HR 62 | Temp 97.7°F | Resp 16 | Ht 63.0 in | Wt 154.1 lb

## 2021-02-09 DIAGNOSIS — E7800 Pure hypercholesterolemia, unspecified: Secondary | ICD-10-CM

## 2021-02-09 DIAGNOSIS — Z1231 Encounter for screening mammogram for malignant neoplasm of breast: Secondary | ICD-10-CM

## 2021-02-09 DIAGNOSIS — I10 Essential (primary) hypertension: Secondary | ICD-10-CM | POA: Diagnosis not present

## 2021-02-09 DIAGNOSIS — Z0000 Encounter for general adult medical examination without abnormal findings: Secondary | ICD-10-CM

## 2021-02-09 MED ORDER — LOSARTAN POTASSIUM-HCTZ 100-25 MG PO TABS: 1.0000 | ORAL_TABLET | Freq: Every day | ORAL | 1 refills | Status: DC

## 2021-02-09 NOTE — Progress Notes (Signed)
Subjective:    Patient ID: Teresa Burch, female    DOB: [birth year], 70 y.o.   MRN: [id number]  DOS:  02/09/2021 Type of visit - description: CPX, here with her daughter.  Feeling well and has no concerns. Denies specifically any chest pain or difficulty breathing. Emotionally feeling  sad, lost a sister Did notice lump on the L ankle.  It hardly ever hurts.  Review of Systems  Other than above, a 14 point review of systems is negative       Past Medical History:  Diagnosis Date   Hypertension     Past Surgical History:  Procedure Laterality Date   BREAST BIOPSY Left 03/2016   (-)   BTL     1980s   HERNIA REPAIR  ~2005   Social History   Socioeconomic History   Marital status: Legally Separated    Spouse name: Not on file   Number of children: 3   Years of education: Not on file   Highest education level: Not on file  Occupational History   Occupation: retired, [employer]  Tobacco Use   Smoking status: Never   Smokeless tobacco: Never  Vaping Use   Vaping Use: Never used  Substance and Sexual Activity   Alcohol use: No   Drug use: No   Sexual activity: Not Currently    Comment: Declined sexual hx questions  Other Topics Concern   Not on file  Social History Narrative   From [place]   Moved to [place] 1967   Lives by herself    3 daughters, 2 daughters in [place] , [place] in [place]   Social Determinants of Health   Financial Resource Strain: Not on file  Food Insecurity: Not on file  Transportation Needs: Not on file  Physical Activity: Not on file  Stress: Not on file  Social Connections: Not on file  Intimate Partner Violence: Not on file     Allergies as of 02/09/2021       Reactions   Aspirin Other (See Comments)   GI upset        Medication List        Accurate as of February 09, 2021 11:59 PM. If you have any questions, ask your nurse or doctor.          losartan-hydrochlorothiazide 100-25 MG tablet Commonly  known as: HYZAAR Take 1 tablet by mouth daily.   PROBIOTIC DAILY PO Take 1 tablet by mouth daily.           Objective:   Physical Exam Musculoskeletal:       Feet:   BP 138/86 (BP Location: Left Arm, Patient Position: Sitting, Cuff Size: Small)   Pulse 62   Temp 97.7 F (36.5 C) (Oral)   Resp 16   Ht 5\' 3"  (1.6 m)   Wt 154 lb 2 oz (69.9 kg)   SpO2 98%   BMI 27.30 kg/m  General: Well developed, NAD, BMI noted Neck: No  thyromegaly  HEENT:  Normocephalic . Face symmetric, atraumatic Lungs:  CTA B Normal respiratory effort, no intercostal retractions, no accessory muscle use. Heart: RRR, soft systolic murmur, better heard at the right side of the sternum. Abdomen:  Not distended, soft, non-tender. No rebound or rigidity.   Lower extremities: no pretibial edema bilaterally  Skin: Exposed areas without rash. Not pale. Not jaundice Neurologic:  alert & oriented X3.  Speech normal, gait appropriate for age and unassisted Strength symmetric and appropriate for age.  Psych:  Cognition and judgment appear intact.  Cooperative with normal attention span and concentration.  Behavior appropriate. No anxious or depressed appearing.     Assessment      Assessment  (new pt 12-2015, previous PCP dr Hilma Favors, @ [place], [place]) HTN - dx ~ 2015 High cholesterol : declines statins consistently L Breast biopsy (-) 03/2016 Had covid infex ~ 11-2020 (in [place])   PLAN: Here for CPX HTN: BP is very good, no change, recommend to check ambulatory BPs High cholesterol: Again states she will not take medications Heart murmur: See previous entries, today I did hear a heart murmur, she has no symptoms, rec echo, declined. Cyst, L ankle: Possibly sebaceous cyst, recommend observation. RTC 1 year   This visit occurred during the SARS-CoV-2 public health emergency.  Safety protocols were in place, including screening questions prior to the visit, additional usage of staff PPE,  and extensive cleaning of exam room while observing appropriate contact time as indicated for disinfecting solutions.

## 2021-02-09 NOTE — Patient Instructions (Addendum)
  You are due for your yearly mammogram 02/2021- we have placed an order to [facility]- please call them at [phone] to schedule an appointment.   Please return the stool cards  Check the  blood pressure every month  BP GOAL is between 110/65 and  135/85. If it is consistently higher or lower, let me know      GO TO THE LAB : Get the blood work     GO TO THE FRONT DESK, PLEASE SCHEDULE YOUR APPOINTMENTS Come back for   physical exam in 1 year     "Living will", "Health Care Power of attorney": Advanced care planning  (If you already have a living will or healthcare power of attorney, please bring the copy to be scanned in your chart.)  Advance care planning is a process that supports adults in  understanding and sharing their preferences regarding future medical care.   The patient's preferences are recorded in documents called Advance Directives.    Advanced directives are completed (and can be modified at any time) while the patient is in full mental capacity.   The documentation should be available at all times to the patient, the family and the healthcare providers.  Bring in a copy to be scanned in your chart is an excellent idea and is recommended   This legal documents direct treatment decision making and/or appoint a surrogate to make the decision if the patient is not capable to do so.    Advance directives can be documented in many types of formats,  documents have names such as:  Lliving will  Durable power of attorney for healthcare (healthcare proxy or healthcare power of attorney)  Combined directives  Physician orders for life-sustaining treatment    More information at:  [email or web address]

## 2021-02-10 LAB — LIPID PANEL
Cholesterol: 206 mg/dL — ABNORMAL HIGH (ref <200)
HDL: 58 mg/dL (ref >=50)
LDL Cholesterol (Calc): 132 mg/dL (calc) — ABNORMAL HIGH
Non-HDL Cholesterol (Calc): 148 mg/dL (calc) — ABNORMAL HIGH (ref <130)
Total CHOL/HDL Ratio: 3.6 (calc) (ref <5.0)
Triglycerides: 72 mg/dL (ref <150)

## 2021-02-10 LAB — CBC WITH DIFFERENTIAL/PLATELET
Absolute Monocytes: 621 cells/uL (ref 200–950)
Basophils Absolute: 51 cells/uL (ref 0–200)
Basophils Relative: 0.7 %
Eosinophils Absolute: 226 cells/uL (ref 15–500)
Eosinophils Relative: 3.1 %
HCT: 38.4 % (ref 35.0–45.0)
Hemoglobin: 12.7 g/dL (ref 11.7–15.5)
Lymphs Abs: 2394 cells/uL (ref 850–3900)
MCH: 30 pg (ref 27.0–33.0)
MCHC: 33.1 g/dL (ref 32.0–36.0)
MCV: 90.6 fL (ref 80.0–100.0)
MPV: 10.9 fL (ref 7.5–12.5)
Monocytes Relative: 8.5 %
Neutro Abs: 4008 cells/uL (ref 1500–7800)
Neutrophils Relative %: 54.9 %
Platelets: 252 10*3/uL (ref 140–400)
RBC: 4.24 10*6/uL (ref 3.80–5.10)
RDW: 13 % (ref 11.0–15.0)
Total Lymphocyte: 32.8 %
WBC: 7.3 10*3/uL (ref 3.8–10.8)

## 2021-02-10 LAB — COMPREHENSIVE METABOLIC PANEL
AG Ratio: 1.6 (calc) (ref 1.0–2.5)
ALT: 19 U/L (ref 6–29)
AST: 25 U/L (ref 10–35)
Albumin: 4.2 g/dL (ref 3.6–5.1)
Alkaline phosphatase (APISO): 61 U/L (ref 37–153)
BUN: 13 mg/dL (ref 7–25)
CO2: 29 mmol/L (ref 20–32)
Calcium: 9.6 mg/dL (ref 8.6–10.4)
Chloride: 97 mmol/L — ABNORMAL LOW (ref 98–110)
Creat: 0.76 mg/dL (ref 0.60–1.00)
Globulin: 2.7 g/dL (calc) (ref 1.9–3.7)
Glucose, Bld: 82 mg/dL (ref 65–99)
Potassium: 3.7 mmol/L (ref 3.5–5.3)
Sodium: 134 mmol/L — ABNORMAL LOW (ref 135–146)
Total Bilirubin: 0.6 mg/dL (ref 0.2–1.2)
Total Protein: 6.9 g/dL (ref 6.1–8.1)

## 2021-02-11 ENCOUNTER — Encounter: Payer: Self-pay | Admitting: Internal Medicine

## 2021-02-11 NOTE — Assessment & Plan Note (Signed)
Here for CPX HTN: BP is very good, no change, recommend to check ambulatory BPs High cholesterol: Again states she will not take medications Heart murmur: See previous entries, today I did hear a heart murmur, she has no symptoms, rec echo, declined. Cyst, L ankle: Possibly sebaceous cyst, recommend observation. RTC 1 year

## 2021-02-11 NOTE — Assessment & Plan Note (Signed)
-  Immunizations:  Again declined ALL except covid shots (x3), rec booster, benefits d/w pt  -Female care:  Saw gyn 03-2020 , no further PAPs Normal DEXA 02/2016, declines re-check  MMG last 02-2020  -CCS: (-)  IFOB 08-2019, likes to cont IFOB.  kit provided -Diet and  Exercise discussed.  Last cholesterol was a slightly elevated, she is trying to do better with diet - labs:  CMP, FLP, CBC  -POA discussed -All instructions printed in [place] and discussed in [place].  Daughter is here and willing to translate AVS

## 2021-03-23 ENCOUNTER — Ambulatory Visit: Payer: Medicare HMO

## 2021-04-01 IMAGING — MG DIGITAL SCREENING BILAT W/ TOMO W/ CAD
8 series · 8 of 24 positions shown · non-contrast
Comparison: Previous exam(s).

CLINICAL DATA: Screening.

EXAM:
DIGITAL SCREENING BILATERAL MAMMOGRAM WITH TOMO AND CAD

[R CC synth-2D]
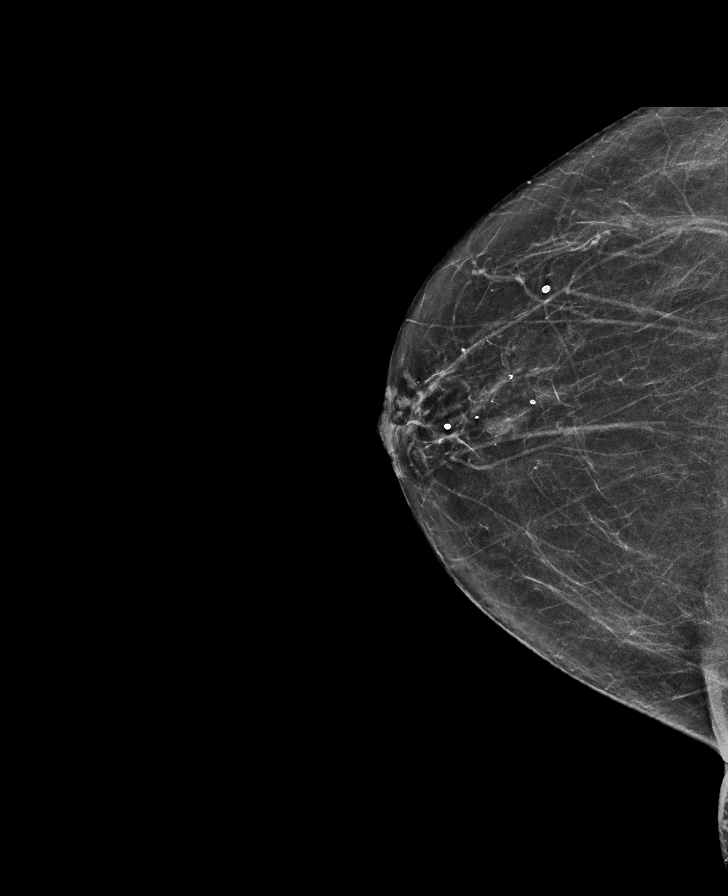

[R MLO synth-2D]
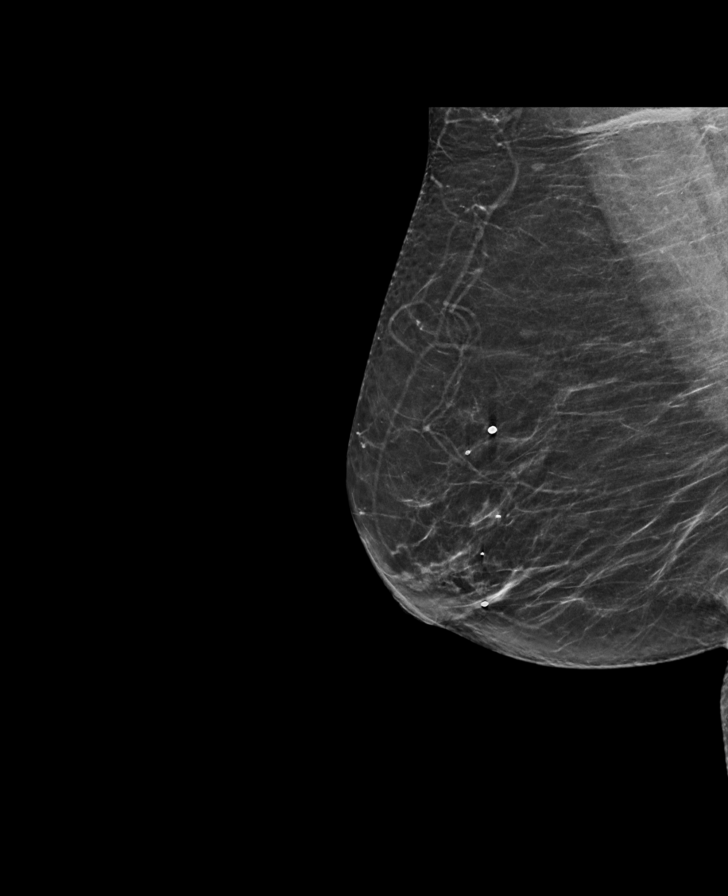

[L MLO synth-2D]
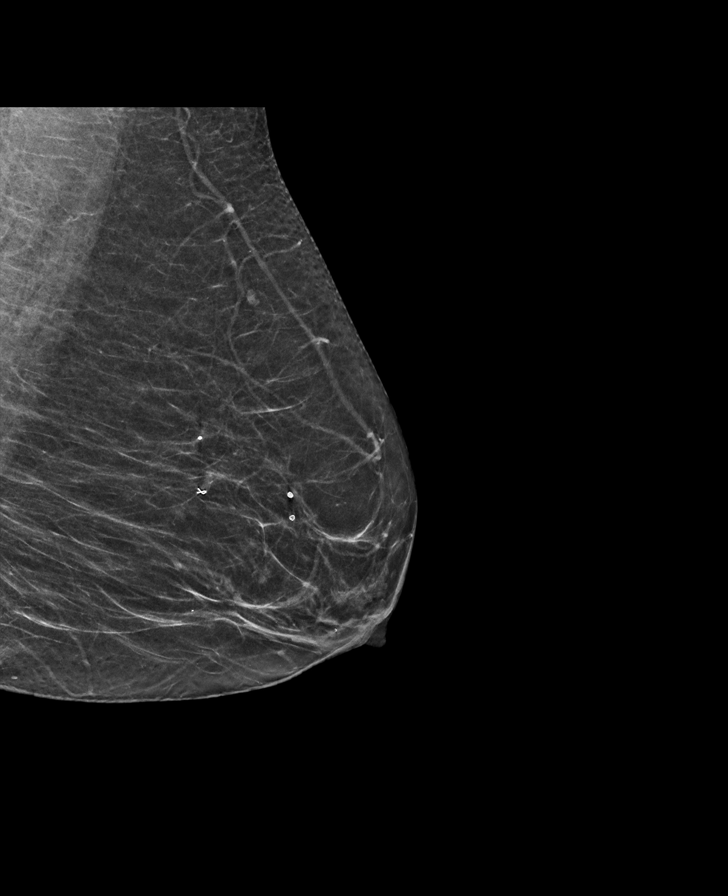

[L CC synth-2D]
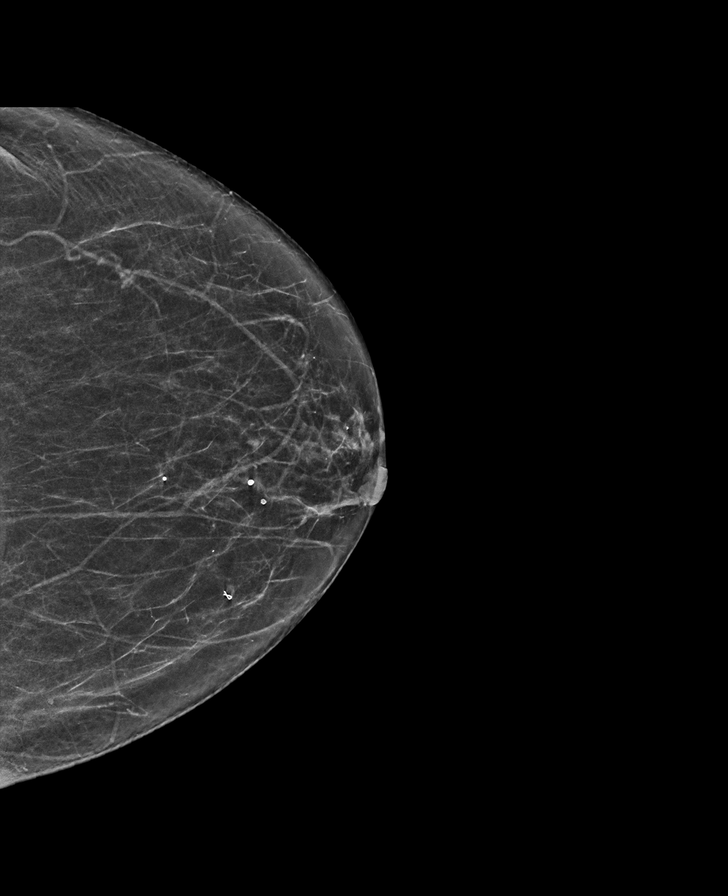

[L CC tomo · tomo slice 29/57.0]
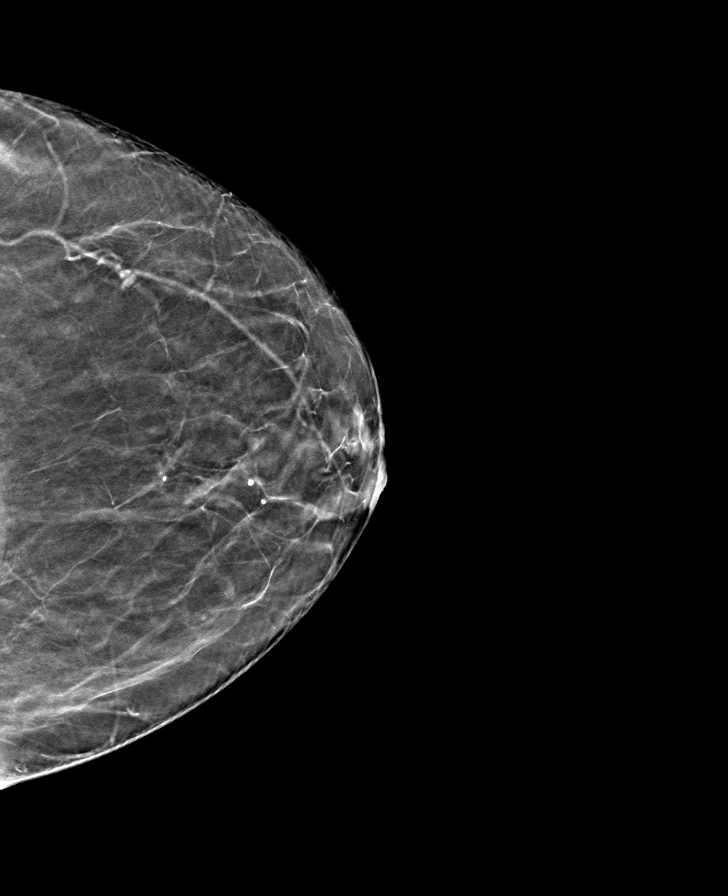

[R MLO tomo · tomo slice 29/58.0]
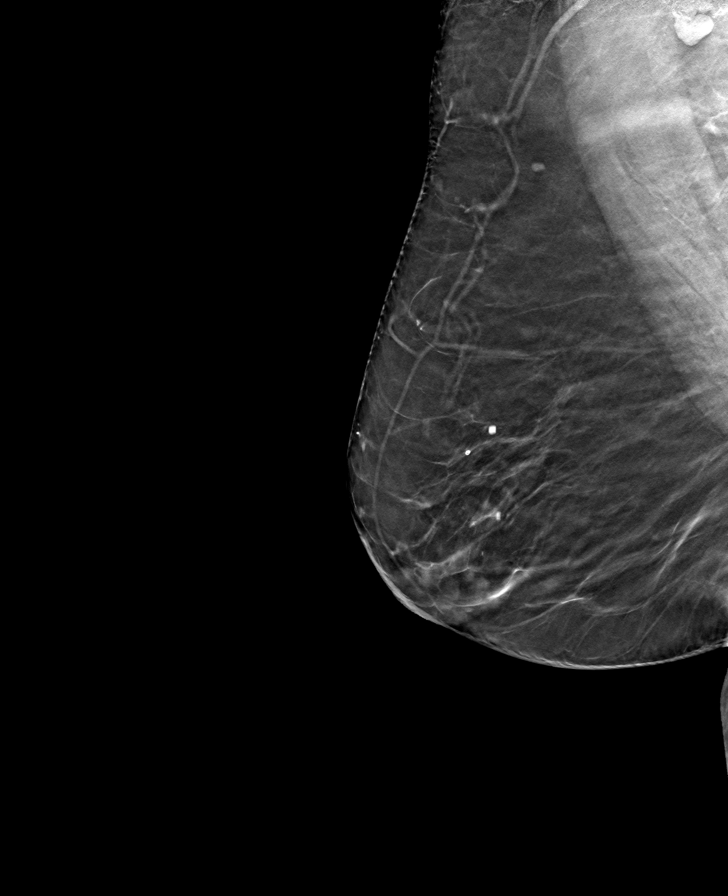

[R CC tomo · tomo slice 30/59.0]
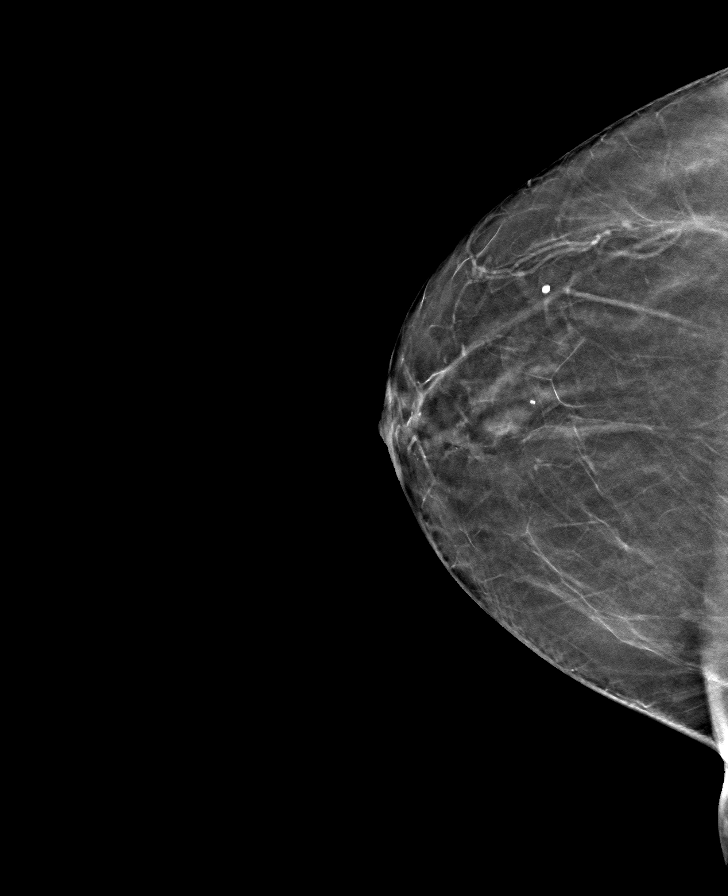

[L MLO tomo · tomo slice 29/56.0]
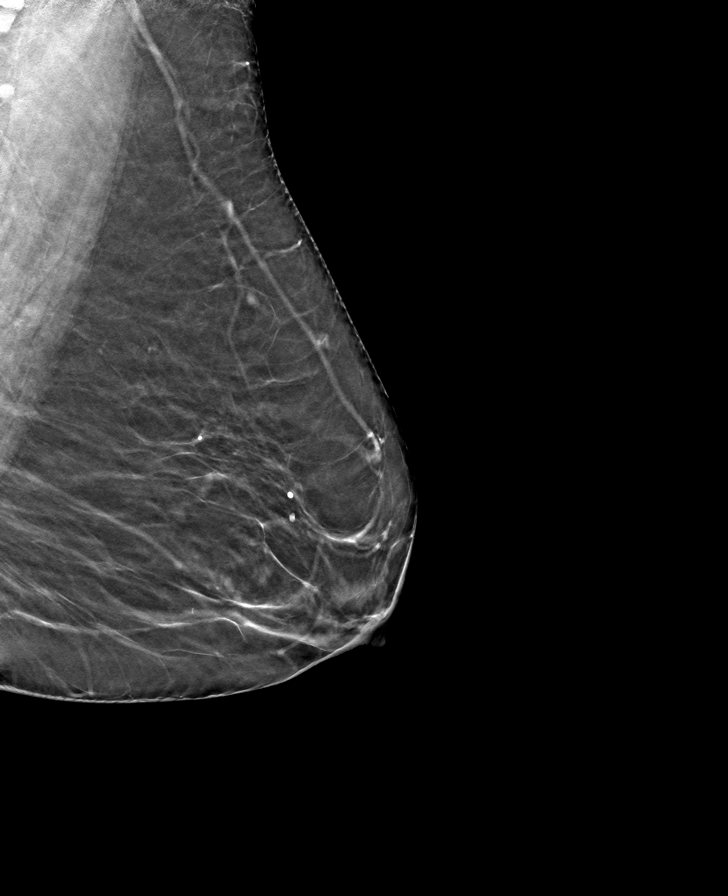

[8 of 24 positions shown; findings below may reference images not displayed]

ACR Breast Density Category b: There are scattered areas of
fibroglandular density.
FINDINGS: There are no findings suspicious for malignancy. Images were
processed with CAD.
IMPRESSION: No mammographic evidence of malignancy. A result letter of this
screening mammogram will be mailed directly to the patient.

RECOMMENDATION:
Screening mammogram in one year. (Code:[id number])

BI-RADS CATEGORY  1: Negative.

## 2021-04-03 ENCOUNTER — Other Ambulatory Visit (INDEPENDENT_AMBULATORY_CARE_PROVIDER_SITE_OTHER): Payer: Medicare HMO

## 2021-04-03 DIAGNOSIS — Z0000 Encounter for general adult medical examination without abnormal findings: Secondary | ICD-10-CM | POA: Diagnosis not present

## 2021-04-03 LAB — FECAL OCCULT BLOOD, IMMUNOCHEMICAL: Fecal Occult Bld: NEGATIVE

## 2021-05-04 ENCOUNTER — Ambulatory Visit: Payer: Medicare HMO

## 2021-05-25 ENCOUNTER — Ambulatory Visit: Payer: Medicare HMO

## 2021-06-01 ENCOUNTER — Ambulatory Visit: Payer: Medicare HMO

## 2021-06-22 ENCOUNTER — Ambulatory Visit: Payer: Medicare HMO

## 2021-08-16 ENCOUNTER — Other Ambulatory Visit: Payer: Self-pay | Admitting: Internal Medicine

## 2021-10-10 ENCOUNTER — Encounter: Payer: Self-pay | Admitting: *Deleted

## 2022-02-11 ENCOUNTER — Other Ambulatory Visit: Payer: Self-pay | Admitting: Internal Medicine

## 2022-02-15 ENCOUNTER — Encounter: Payer: Medicare HMO | Admitting: Internal Medicine

## 2022-03-29 ENCOUNTER — Encounter: Payer: Medicare HMO | Admitting: Internal Medicine

## 2022-05-03 ENCOUNTER — Encounter: Payer: Self-pay | Admitting: Internal Medicine

## 2022-05-03 ENCOUNTER — Ambulatory Visit (HOSPITAL_BASED_OUTPATIENT_CLINIC_OR_DEPARTMENT_OTHER)
Admission: RE | Admit: 2022-05-03 | Discharge: 2022-05-03 | Disposition: A | Discharge status: Home / Self Care | Payer: Medicare HMO | Source: Ambulatory Visit | Attending: Internal Medicine | Admitting: Internal Medicine

## 2022-05-03 ENCOUNTER — Ambulatory Visit (INDEPENDENT_AMBULATORY_CARE_PROVIDER_SITE_OTHER): Payer: Medicare HMO | Admitting: Internal Medicine

## 2022-05-03 VITALS — BP 134/78 | HR 88 | Temp 98.0°F | Resp 16 | Ht 63.0 in | Wt 154.5 lb

## 2022-05-03 DIAGNOSIS — M25572 Pain in left ankle and joints of left foot: Secondary | ICD-10-CM

## 2022-05-03 NOTE — Patient Instructions (Addendum)
Stop by the first floor and get a x-ray  We are referring you to our sports medicine doctor in [place]  See you next month for your physical exam

## 2022-05-03 NOTE — Progress Notes (Unsigned)
   Subjective:    Patient ID: Teresa Burch, female    DOB: 07-18-1950, 72 y.o.   MRN: [id number]  DOS:  05/03/2022 Type of visit - description: Acute Reports on and off pain at the left ankle, medial aspect. She also has noted a lump there. Occasionally the area swells up but is not red or warm to touch. Symptoms are worse whenever she is staying on her feet for long time.  Good compliance with BP meds, ambulatory BPs usually normal.   Review of Systems See above   Past Medical History:  Diagnosis Date   Hypertension     Past Surgical History:  Procedure Laterality Date   BREAST BIOPSY Left 03/2016   (-)   BTL     1980s   HERNIA REPAIR  ~2005    Current Outpatient Medications  Medication Instructions   losartan-hydrochlorothiazide (HYZAAR) 100-25 MG tablet 1 tablet, Oral, Daily   Probiotic Product (PROBIOTIC DAILY PO) 1 tablet, Oral, Daily       Objective:   Physical Exam Musculoskeletal:       Feet:  Feet:     Comments: Soft, nontender lump at the left ankle.   BP 134/78   Pulse 88   Temp 98 F (36.7 C) (Oral)   Resp 16   Ht 5\' 3"  (1.6 m)   Wt 154 lb 8 oz (70.1 kg)   SpO2 97%   BMI 27.37 kg/m  General:   Well developed, NAD, BMI noted. HEENT:  Normocephalic . Face symmetric, atraumatic Lungs:  CTA B Normal respiratory effort, no intercostal retractions, no accessory muscle use. Heart: RRR,  no murmur.  Lower extremities: Right foot and ankle: No edema, no joint effusion, toes are well-perfused. Left foot and ankle: No edema, no joint effusion, toes well-perfused.  See graphic Skin: Not pale. Not jaundice Neurologic:  alert & oriented X3.  Speech normal, gait appropriate for age and unassisted Psych--  Cognition and judgment appear intact.  Cooperative with normal attention span and concentration.  Behavior appropriate. No anxious or depressed appearing.      Assessment      Assessment  (new pt 12-2015, previous PCP dr Flonnie Overman, @  [place], [place]) HTN - dx ~ 2015 High cholesterol : declines statins consistently L Breast biopsy (-) 03/2016 Had covid infex ~ 0-5397 (in [place])   PLAN: L ankle pain, cyst? More than 1 year history of left ankle pain on and off and also a lump, on clinical grounds cyst. The patient remains concerned about it.  She also have varicose veins with no evidence of phlebitis today. Plan: X-ray, refer to sports medicine (patient would like to know for sure what  is, perhaps sports medicine will do a soft tissue ultrasound). Vaccines: Declined all RTC scheduled for next month.    It were ordered today. 01-2021 Here for CPX HTN: BP is very good, no change, recommend to check ambulatory BPs High cholesterol: Again states she will not take medications Heart murmur: See previous entries, today I did hear a heart murmur, she has no symptoms, rec echo, declined. Cyst, L ankle: Possibly sebaceous cyst, recommend observation. RTC 1 year

## 2022-05-04 NOTE — Assessment & Plan Note (Signed)
L ankle pain, cyst? More than 1 year history of left ankle pain on and off and also a lump, on clinical grounds cyst. The patient remains concerned about it.  She also have varicose veins with no evidence of phlebitis today. Plan: X-ray, refer to sports medicine (patient would like to know for sure what the mass is, perhaps sports medicine will do a soft tissue ultrasound). Vaccines: Declined all RTC scheduled for next month.

## 2022-05-09 NOTE — Progress Notes (Signed)
   I, Peterson Lombard, LAT, ATC acting as a scribe for Lynne Leader, MD.  Subjective:    CC: Left ankle pain  HPI: Patient is a 72 year old female presenting with left ankle pain ongoing intermittently for over a year, worsening.  Patient locates pain to the medial aspect of her left ankle.  She tried an ankle wrap which she found uncomfortable.  Additionally she tried some lidocaine patches which did not help.  L ankle swelling: yes- intermittently, esp w/ increased activity Aggravates: walking, increased activity Treatments tried: lidocaine patches,   Dx imaging: 05/03/22 L ankle XR  Pertinent review of Systems: No fevers or chills  Relevant historical information: Tension   Objective:    Vitals:   05/10/22 0908  BP: (!) 142/86  Pulse: 78  SpO2: 97%   General: Well Developed, well nourished, and in no acute distress.   MSK: Left ankle: Swelling medial ankle.  Ankle pronation and subluxation medially present.  Tender palpation medial ankle along the course of the posterior tibialis tendon.  Normal ankle motion.  Intact strength.  Some pain present with ankle inversion and plantarflexion. Pulses cap refill and sensation are intact distally.  Lab and Radiology Results  Diagnostic Limited MSK Ultrasound of: Left ankle Hypoechoic fluid surrounds the posterior tibialis tendon within the tendon sheath significantly. The posterior tibialis tendon does appear to be intact without visible tear. This is consistent with significant posterior tibialis tenosynovitis. The medial ankle shows mild degeneration but otherwise is relatively normal-appearing Impression: Significant posterior tibialis tenosynovitis.  EXAM: LEFT ANKLE COMPLETE - 3 VIEW   COMPARISON:  None Available.   FINDINGS: There is no evidence of fracture, dislocation, or joint effusion. There is no evidence of arthropathy or other focal bone abnormality. There are posterior and plantar calcaneal spurs   Soft  tissue swelling noted at the medial malleolus.   IMPRESSION: Soft tissue swelling. No acute osseous abnormality identified.     Electronically Signed   By: Sammie Bench M.D.   On: 05/05/2022 18:39   I, Lynne Leader, personally (independently) visualized and performed the interpretation of the images attached in this note.   Impression and Recommendations:    Assessment and Plan: 72 y.o. female with left ankle pain and swelling thought to be due to posterior tibialis tenosynovitis.  Her ankle pronation and subluxation is likely a factor here.  Plan for home exercise program taught in clinic today by ATC, Voltaren gel, compression and arch support.  Recheck in 1 month.  Consider physical therapy if not better.  She is not interested in an injection today but that is an option if not better in the future.Marland Kitchen  PDMP not reviewed this encounter. Orders Placed This Encounter  Procedures   [place] LIMITED JOINT SPACE STRUCTURES LOW LEFT(NO LINKED CHARGES)    Order Specific Question:   Reason for Exam (SYMPTOM  OR DIAGNOSIS REQUIRED)    Answer:   left ankle pain    Order Specific Question:   Preferred imaging location?    Answer:   [facility]   No orders of the defined types were placed in this encounter.   Discussed warning signs or symptoms. Please see discharge instructions. Patient expresses understanding.   The above documentation has been reviewed and is accurate and complete Lynne Leader, M.D.

## 2022-05-10 ENCOUNTER — Ambulatory Visit (INDEPENDENT_AMBULATORY_CARE_PROVIDER_SITE_OTHER): Payer: Medicare HMO | Admitting: Family Medicine

## 2022-05-10 ENCOUNTER — Ambulatory Visit: Payer: Self-pay

## 2022-05-10 VITALS — BP 142/86 | HR 78 | Ht 63.0 in | Wt 158.0 lb

## 2022-05-10 DIAGNOSIS — M25572 Pain in left ankle and joints of left foot: Secondary | ICD-10-CM

## 2022-05-10 DIAGNOSIS — G8929 Other chronic pain: Secondary | ICD-10-CM

## 2022-05-10 NOTE — Patient Instructions (Addendum)
Thank you for coming in today.   Get good over the counter insoles for rch support like superfeet.  Fleet feet would have them.   I recommend you obtained a compression sleeve to help with your joint problems. There are many options on the market however I recommend obtaining a full ankle Body Helix compression sleeve.  You can find information (including how to appropriate measure yourself for sizing) can be found at www.Body http://www.lambert.com/.  Many of these products are health savings account (HSA) eligible.   You can use the compression sleeve at any time throughout the day but is most important to use while being active as well as for 2 hours post-activity.   It is appropriate to ice following activity with the compression sleeve in place.   Please complete the exercises that the athletic trainer went over with you:  View at www.my-exercise-code.com using code: YYDGPZL  Please use Voltaren gel (Generic Diclofenac Gel) up to 4x daily for pain as needed.  This is available over-the-counter as both the name brand Voltaren gel and the generic diclofenac gel.   Recheck in 1 month.

## 2022-05-22 ENCOUNTER — Encounter: Payer: Medicare HMO | Admitting: Internal Medicine

## 2022-05-24 ENCOUNTER — Ambulatory Visit (INDEPENDENT_AMBULATORY_CARE_PROVIDER_SITE_OTHER): Payer: Medicare HMO | Admitting: Internal Medicine

## 2022-05-24 ENCOUNTER — Encounter: Payer: Self-pay | Admitting: Internal Medicine

## 2022-05-24 VITALS — BP 126/82 | HR 69 | Temp 97.9°F | Resp 16 | Ht 63.0 in | Wt 152.5 lb

## 2022-05-24 DIAGNOSIS — I10 Essential (primary) hypertension: Secondary | ICD-10-CM | POA: Diagnosis not present

## 2022-05-24 DIAGNOSIS — Z1231 Encounter for screening mammogram for malignant neoplasm of breast: Secondary | ICD-10-CM

## 2022-05-24 DIAGNOSIS — E7800 Pure hypercholesterolemia, unspecified: Secondary | ICD-10-CM

## 2022-05-24 DIAGNOSIS — Z0000 Encounter for general adult medical examination without abnormal findings: Secondary | ICD-10-CM | POA: Diagnosis not present

## 2022-05-24 LAB — LIPID PANEL
Cholesterol: 211 mg/dL — ABNORMAL HIGH (ref 0–200)
HDL: 55.4 mg/dL (ref >39.00)
LDL Cholesterol: 139 mg/dL — ABNORMAL HIGH (ref 0–99)
NonHDL: 155.27
Total CHOL/HDL Ratio: 4
Triglycerides: 80 mg/dL (ref 0.0–149.0)
VLDL: 16 mg/dL (ref 0.0–40.0)

## 2022-05-24 LAB — COMPREHENSIVE METABOLIC PANEL
ALT: 18 U/L (ref 0–35)
AST: 23 U/L (ref 0–37)
Albumin: 4.2 g/dL (ref 3.5–5.2)
Alkaline Phosphatase: 60 U/L (ref 39–117)
BUN: 7 mg/dL (ref 6–23)
CO2: 31 mEq/L (ref 19–32)
Calcium: 9.6 mg/dL (ref 8.4–10.5)
Chloride: 99 mEq/L (ref 96–112)
Creatinine, Ser: 0.7 mg/dL (ref 0.40–1.20)
GFR: 85.52 mL/min (ref >60.00)
Glucose, Bld: 95 mg/dL (ref 70–99)
Potassium: 4.5 mEq/L (ref 3.5–5.1)
Sodium: 135 mEq/L (ref 135–145)
Total Bilirubin: 0.8 mg/dL (ref 0.2–1.2)
Total Protein: 6.9 g/dL (ref 6.0–8.3)

## 2022-05-24 LAB — CBC WITH DIFFERENTIAL/PLATELET
Basophils Absolute: 0.1 10*3/uL (ref 0.0–0.1)
Basophils Relative: 1.2 % (ref 0.0–3.0)
Eosinophils Absolute: 0.2 10*3/uL (ref 0.0–0.7)
Eosinophils Relative: 3.9 % (ref 0.0–5.0)
HCT: 38.7 % (ref 36.0–46.0)
Hemoglobin: 13 g/dL (ref 12.0–15.0)
Lymphocytes Relative: 31.3 % (ref 12.0–46.0)
Lymphs Abs: 1.9 10*3/uL (ref 0.7–4.0)
MCHC: 33.6 g/dL (ref 30.0–36.0)
MCV: 88.8 fl (ref 78.0–100.0)
Monocytes Absolute: 0.5 10*3/uL (ref 0.1–1.0)
Monocytes Relative: 8.3 % (ref 3.0–12.0)
Neutro Abs: 3.3 10*3/uL (ref 1.4–7.7)
Neutrophils Relative %: 55.3 % (ref 43.0–77.0)
Platelets: 259 10*3/uL (ref 150.0–400.0)
RBC: 4.35 Mil/uL (ref 3.87–5.11)
RDW: 14.4 % (ref 11.5–15.5)
WBC: 6 10*3/uL (ref 4.0–10.5)

## 2022-05-24 LAB — TSH: TSH: 2.66 u[IU]/mL (ref 0.35–5.50)

## 2022-05-24 NOTE — Patient Instructions (Addendum)
POR FAVOR LEA LA INFORMACION ACERCA DE "DIRECTIVA ANTICIPADA"  VAYA AL LABORATORIO A SACARSE Teresa Burch EN UN AN~O  VAYA AL PRIMER PISO Y SAQUE UNA CITA PARA LA MAMOGRAFIA   GO TO THE LAB : Get the blood work     GO TO THE FRONT DESK, [place] Come back for a physical exam in 1 year  STOP BY THE FIRST FLOOR: Arrange mammogram

## 2022-05-24 NOTE — Progress Notes (Unsigned)
   Subjective:    Patient ID: Teresa Burch, female    DOB: [birth year], 72 y.o.   MRN: [id number]  DOS:  05/24/2022 Type of visit - description: cpx here by herself Here for CPX Has no concerns. No new symptoms. Ambulatory BPs when checked normal   Review of Systems See above   Past Medical History:  Diagnosis Date   Hypertension     Past Surgical History:  Procedure Laterality Date   BREAST BIOPSY Left 03/2016   (-)   BTL     1980s   HERNIA REPAIR  ~2005    Current Outpatient Medications  Medication Instructions   losartan-hydrochlorothiazide (HYZAAR) 100-25 MG tablet 1 tablet, Oral, Daily   Probiotic Product (PROBIOTIC DAILY PO) 1 tablet, Oral, Daily       Objective:   Physical Exam BP 126/82   Pulse 69   Temp 97.9 F (36.6 C) (Oral)   Resp 16   Ht 5\' 3"  (1.6 m)   Wt 152 lb 8 oz (69.2 kg)   SpO2 96%   BMI 27.01 kg/m  General: Well developed, NAD, BMI noted Neck: No  thyromegaly  HEENT:  Normocephalic . Face symmetric, atraumatic Lungs:  CTA B Normal respiratory effort, no intercostal retractions, no accessory muscle use. Heart: RRR,  no murmur.  Abdomen:  Not distended, soft, non-tender. No rebound or rigidity.   Lower extremities: no pretibial edema bilaterally  Skin: Exposed areas without rash. Not pale. Not jaundice Neurologic:  alert & oriented X3.  Speech normal, gait appropriate for age and unassisted Strength symmetric and appropriate for age.  Psych: Cognition and judgment appear intact.  Cooperative with normal attention span and concentration.  Behavior appropriate. No anxious or depressed appearing.     Assessment   Assessment  (new pt 12-2015, previous PCP dr Flonnie Overman, @ [place], [place]) HTN - dx ~ 2015 High cholesterol : declines statins consistently L Breast biopsy (-) 03/2016 Had covid infex ~ 08-2839 (in [place])   PLAN: Here for CPX HTN: Well-controlled on Hyzaar, checking labs. High cholesterol: Has declined  statins, checking labs. RTC in 1 year as long as blood work came back okay, ambulatory BPs are normal and she feels well.  She verbalized understanding.   Immunizations: Declines any additional vaccines. -Female care:  LOV gyn 03-2020 , no further PAPs Normal DEXA 02/2016, declines re-check again today MMG last 02-2020.  Agreed to proceed. -CCS: (-)  IFOB 08-2019 and 03-2021; declines colonoscopy, agreed IFOB -Diet and  Exercise discussed.    - labs:  CMP FLP CBC TSH -POA information provided in Spanish -All instructions printed in [place] and discussed in Spanish.      2  L ankle pain, cyst? More than 1 year history of left ankle pain on and off and also a lump, on clinical grounds cyst. The patient remains concerned about it.  She also have varicose veins with no evidence of phlebitis today. Plan: X-ray, refer to sports medicine (patient would like to know for sure what the mass is, perhaps sports medicine will do a soft tissue ultrasound). Vaccines: Declined all RTC scheduled for next month.

## 2022-05-25 ENCOUNTER — Encounter: Payer: Self-pay | Admitting: Internal Medicine

## 2022-05-25 NOTE — Assessment & Plan Note (Signed)
Here for CPX HTN: Well-controlled on Hyzaar, checking labs. High cholesterol: Has declined statins, checking labs. RTC in 1 year as long as blood work came back okay, ambulatory BPs are normal and she feels well.  She verbalized understanding.

## 2022-05-25 NOTE — Assessment & Plan Note (Signed)
Immunizations: Declines any additional vaccines. -Female care:  LOV gyn 03-2020 , no further PAPs Normal DEXA 02/2016, declines re-check again today MMG last 02-2020.  Agreed to proceed. -CCS: (-)  IFOB 08-2019 and 03-2021; declines colonoscopy, agreed IFOB -Diet and  Exercise discussed.    - labs:  CMP FLP CBC TSH -POA information provided in Spanish -All instructions   discussed in Spanish.

## 2022-06-04 ENCOUNTER — Ambulatory Visit (HOSPITAL_BASED_OUTPATIENT_CLINIC_OR_DEPARTMENT_OTHER): Payer: Medicare HMO

## 2022-06-13 ENCOUNTER — Other Ambulatory Visit (INDEPENDENT_AMBULATORY_CARE_PROVIDER_SITE_OTHER): Payer: Medicare HMO

## 2022-06-13 DIAGNOSIS — Z0000 Encounter for general adult medical examination without abnormal findings: Secondary | ICD-10-CM

## 2022-06-13 NOTE — Progress Notes (Deleted)
   I, Peterson Lombard, LAT, ATC acting as a scribe for Lynne Leader, MD.  Teresa Burch is a 72 y.o. 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 Pt was last seen by Dr. CGeorgina Snellon 05/10/22 and was advised to use Voltaren gel, compression, arch support and was taught HEP. Today, pt reports ***.  Dx imaging: 05/03/22 L ankle XR   Pertinent review of systems: ***  Relevant historical information: ***   Exam:  There were no vitals taken for this visit. General: Well Developed, well nourished, and in no acute distress.   MSK: ***    Lab and Radiology Results No results found for this or any previous visit (from the past 72 hour(s)). No results found.     Assessment and Plan: 72y.o. female with ***   PDMP not reviewed this encounter. No orders of the defined types were placed in this encounter.  No orders of the defined types were placed in this encounter.    Discussed warning signs or symptoms. Please see discharge instructions. Patient expresses understanding.   ***

## 2022-06-14 ENCOUNTER — Ambulatory Visit: Payer: Medicare HMO | Admitting: Family Medicine

## 2022-06-14 LAB — FECAL OCCULT BLOOD, IMMUNOCHEMICAL: Fecal Occult Bld: NEGATIVE

## 2022-07-12 ENCOUNTER — Ambulatory Visit
Admission: RE | Admit: 2022-07-12 | Discharge: 2022-07-12 | Disposition: A | Discharge status: Home / Self Care | Payer: Medicare HMO | Source: Ambulatory Visit | Attending: Internal Medicine | Admitting: Internal Medicine

## 2022-07-12 DIAGNOSIS — Z1231 Encounter for screening mammogram for malignant neoplasm of breast: Secondary | ICD-10-CM | POA: Diagnosis not present

## 2022-08-08 ENCOUNTER — Other Ambulatory Visit: Payer: Self-pay | Admitting: Internal Medicine

## 2022-09-20 ENCOUNTER — Ambulatory Visit (INDEPENDENT_AMBULATORY_CARE_PROVIDER_SITE_OTHER): Payer: Medicare HMO | Admitting: Internal Medicine

## 2022-09-20 VITALS — BP 126/78 | HR 75 | Temp 97.8°F | Resp 16 | Ht 63.0 in | Wt 154.6 lb

## 2022-09-20 DIAGNOSIS — M79672 Pain in left foot: Secondary | ICD-10-CM

## 2022-09-20 DIAGNOSIS — M7752 Other enthesopathy of left foot: Secondary | ICD-10-CM | POA: Diagnosis not present

## 2022-09-20 MED ORDER — PREDNISONE 10 MG PO TABS: ORAL_TABLET | ORAL | 0 refills | Status: DC

## 2022-09-20 NOTE — Progress Notes (Signed)
   Subjective:    Patient ID: Teresa Burch, female    DOB: 09-Feb-1951, 72 y.o.   MRN: [id number]  DOS:  09/20/2022 Type of visit - description: Acute, here with her daughter  For reassessment of left ankle pain. The pain and swelling are going on for over a year. Is worse with extension of the ankle.  Not worse with flexion.  Symptoms are steady, does not have as bouts of pain/swelling/redness/warmness like gout attacks would be.   Review of Systems See above   Past Medical History:  Diagnosis Date   Hypertension     Past Surgical History:  Procedure Laterality Date   BREAST BIOPSY Left 03/2016   (-)   BTL     1980s   HERNIA REPAIR  ~2005    Current Outpatient Medications  Medication Instructions   losartan-hydrochlorothiazide (HYZAAR) 100-25 MG tablet 1 tablet, Oral, Daily   Probiotic Product (PROBIOTIC DAILY PO) 1 tablet, Oral, Daily       Objective:   Physical Exam BP 126/78 (BP Location: Left Arm, Patient Position: Sitting, Cuff Size: Normal)   Pulse 75   Temp 97.8 F (36.6 C) (Oral)   Resp 16   Ht 5\' 3"  (1.6 m)   Wt 154 lb 9.6 oz (70.1 kg)   SpO2 97%   BMI 27.39 kg/m  General:   Well developed, NAD, BMI noted. HEENT:  Normocephalic . Face symmetric, atraumatic Lower extremities:  R ankle normal L ankle: + Mild swelling tenderness to palpation at the inner malleoli.  Not warm, red. Good pedal pulses. Calves symmetric, not TTP Skin: Not pale. Not jaundice Neurologic:  alert & oriented X3.  Speech normal, gait appropriate for age and unassisted Psych--  Cognition and judgment appear intact.  Cooperative with normal attention span and concentration.  Behavior appropriate. No anxious or depressed appearing.      Assessment    Assessment  (new pt 12-2015, previous PCP dr Hilma Favors, @ [place], [place]) HTN - dx ~ 2015 High cholesterol : declines statins consistently L Breast biopsy (-) 03/2016 Had covid infex ~ 11-2020 (in [place])   PLAN: L ankle pain: Seen at this office, x-ray was essentially negative and subsequently referred to sports medicine.  Was seen by them 05/10/2022, a ankle ultrasound showed  Significant posterior tibialis tenosynovitis. Was recommended conservative treatment, she is here b/c feels no better.  Request another opinion. Plan: Round of prednisone, continue Tylenol, recommend ice rather than heat, ankle brace. Refer to ortho foot for further eval and treatment.

## 2022-09-20 NOTE — Assessment & Plan Note (Signed)
L ankle pain: Seen at this office, x-ray was essentially negative and subsequently referred to sports medicine.  Was seen by them 05/10/2022, a ankle ultrasound showed  Significant posterior tibialis tenosynovitis. Was recommended conservative treatment, she is here b/c feels no better.  Request another opinion. Plan: Round of prednisone, continue Tylenol, recommend ice rather than heat, ankle brace. Refer to ortho foot for further eval and treatment.

## 2022-09-20 NOTE — Patient Instructions (Signed)
Take prednisone as prescribed  Ice the area 2-3 times a day  Tylenol  500 mg OTC 2 tabs a day every 8 hours as needed for pain  Use ankle brace  We are referring you to a orthopedic doctor.

## 2022-10-11 DIAGNOSIS — M79672 Pain in left foot: Secondary | ICD-10-CM | POA: Diagnosis not present

## 2022-10-11 DIAGNOSIS — M67962 Unspecified disorder of synovium and tendon, left lower leg: Secondary | ICD-10-CM | POA: Diagnosis not present

## 2022-10-11 DIAGNOSIS — M25572 Pain in left ankle and joints of left foot: Secondary | ICD-10-CM | POA: Diagnosis not present

## 2022-11-18 ENCOUNTER — Telehealth: Payer: Self-pay | Admitting: Internal Medicine

## 2022-11-18 NOTE — Telephone Encounter (Signed)
Copied from CRM [id number]. Topic: Medicare AWV >> Nov 18, 2022 10:16 AM Payton Doughty wrote: Reason for CRM: LM 11/18/2022 to schedule AWV   Verlee Rossetti; Care Guide Ambulatory Clinical Support [place] [facility] Direct Dial: [phone]

## 2022-11-19 NOTE — Telephone Encounter (Signed)
Pt returned call and stated does not want to schedule the Wellness visit for now, but will come to her cpe on 05-30-2023 with provider. Done

## 2023-01-11 DIAGNOSIS — H524 Presbyopia: Secondary | ICD-10-CM | POA: Diagnosis not present

## 2023-02-05 ENCOUNTER — Ambulatory Visit: Payer: Medicare HMO | Admitting: Physician Assistant

## 2023-02-05 ENCOUNTER — Encounter: Payer: Self-pay | Admitting: Physician Assistant

## 2023-02-05 VITALS — BP 138/88 | HR 85 | Temp 98.3°F | Ht 63.0 in | Wt 153.5 lb

## 2023-02-05 DIAGNOSIS — K4090 Unilateral inguinal hernia, without obstruction or gangrene, not specified as recurrent: Secondary | ICD-10-CM

## 2023-02-05 NOTE — Progress Notes (Signed)
      Established patient visit   Patient: Teresa Burch   DOB: 1950-04-25   72 y.o. Female  MRN: [id number] Visit Date: 02/05/2023  Today's healthcare provider: Alfredia Ferguson, PA-C   Chief Complaint  Patient presents with   Mass    Patient states she is feeling a lump on right side abdomen, pain goes front to back. Does have history of hernias    Subjective     Pt reports feeling a lump on the right side of her lower abdomen. Reports it started small and now is larger. She reports a hernia repair in this area in 2005. Denies pain. Reports pain left lower back that radiates down her left leg-- but this is unrelated to her abdominal pain and has been present for months.  Pt is accompanied by family today, who serves as translation. Official translation services were declined.  Medications: Outpatient Medications Prior to Visit  Medication Sig   losartan-hydrochlorothiazide (HYZAAR) 100-25 MG tablet Take 1 tablet by mouth daily.   predniSONE (DELTASONE) 10 MG tablet 3 tabs x 3 days, 2 tabs x 3 days, 1 tab x 3 days   Probiotic Product (PROBIOTIC DAILY PO) Take 1 tablet by mouth daily.   No facility-administered medications prior to visit.    Review of Systems  Constitutional:  Negative for fatigue and fever.  Respiratory:  Negative for cough and shortness of breath.   Cardiovascular:  Negative for chest pain and leg swelling.  Gastrointestinal:  Negative for abdominal pain.  Neurological:  Negative for dizziness and headaches.       Objective    BP 138/88   Pulse 85   Temp 98.3 F (36.8 C)   Ht 5\' 3"  (1.6 m)   Wt 153 lb 8 oz (69.6 kg)   SpO2 99%   BMI 27.19 kg/m    Physical Exam Vitals reviewed.  Constitutional:      Appearance: She is not ill-appearing.  HENT:     Head: Normocephalic.  Eyes:     Conjunctiva/sclera: Conjunctivae normal.  Cardiovascular:     Rate and Rhythm: Normal rate.  Pulmonary:     Effort: Pulmonary effort is normal. No respiratory  distress.  Abdominal:     Comments: Right groin with a soft, reducible, 4-5 cm mass.  Less palpable when pt is supine.   Neurological:     General: No focal deficit present.     Mental Status: She is alert and oriented to person, place, and time.  Psychiatric:        Mood and Affect: Mood normal.        Behavior: Behavior normal.     No results found for any visits on 02/05/23.  Assessment & Plan    Right inguinal hernia Advised pt of warning signs of hernias, when to seek emergency care Given area has grown in size over the last few months, referring to surgery for their opinion on surgical management .  -     Ambulatory referral to General Surgery     Return if symptoms worsen or fail to improve.       Alfredia Ferguson, PA-C  [facility] [phone] (phone) [phone] (fax)  [facility]

## 2023-02-25 DIAGNOSIS — K4091 Unilateral inguinal hernia, without obstruction or gangrene, recurrent: Secondary | ICD-10-CM | POA: Diagnosis not present

## 2023-03-06 ENCOUNTER — Encounter: Payer: Self-pay | Admitting: Internal Medicine

## 2023-03-07 ENCOUNTER — Encounter (HOSPITAL_COMMUNITY): Payer: Self-pay | Admitting: Student in an Organized Health Care Education/Training Program

## 2023-03-07 ENCOUNTER — Other Ambulatory Visit (HOSPITAL_COMMUNITY): Payer: Self-pay | Admitting: Student in an Organized Health Care Education/Training Program

## 2023-03-07 DIAGNOSIS — K4091 Unilateral inguinal hernia, without obstruction or gangrene, recurrent: Secondary | ICD-10-CM

## 2023-03-18 ENCOUNTER — Ambulatory Visit (HOSPITAL_COMMUNITY): Payer: Medicare HMO

## 2023-03-27 ENCOUNTER — Ambulatory Visit (HOSPITAL_COMMUNITY)
Admission: RE | Admit: 2023-03-27 | Discharge: 2023-03-27 | Disposition: A | Discharge status: Home / Self Care | Payer: Medicare HMO | Source: Ambulatory Visit | Attending: Student in an Organized Health Care Education/Training Program | Admitting: Student in an Organized Health Care Education/Training Program

## 2023-03-27 DIAGNOSIS — K4091 Unilateral inguinal hernia, without obstruction or gangrene, recurrent: Secondary | ICD-10-CM | POA: Insufficient documentation

## 2023-03-27 DIAGNOSIS — K7689 Other specified diseases of liver: Secondary | ICD-10-CM | POA: Diagnosis not present

## 2023-03-27 DIAGNOSIS — K5730 Diverticulosis of large intestine without perforation or abscess without bleeding: Secondary | ICD-10-CM | POA: Diagnosis not present

## 2023-03-27 DIAGNOSIS — I10 Essential (primary) hypertension: Secondary | ICD-10-CM | POA: Insufficient documentation

## 2023-03-27 LAB — POCT I-STAT CREATININE: Creatinine, Ser: 0.8 mg/dL (ref 0.44–1.00)

## 2023-03-27 MED ORDER — IOHEXOL 300 MG/ML  SOLN
100.0000 mL | Freq: Once | INTRAMUSCULAR | Status: AC | PRN
  Administered 2023-03-27: 100 mL via INTRAVENOUS

## 2023-04-14 DIAGNOSIS — K4091 Unilateral inguinal hernia, without obstruction or gangrene, recurrent: Secondary | ICD-10-CM | POA: Insufficient documentation

## 2023-05-02 DIAGNOSIS — K4021 Bilateral inguinal hernia, without obstruction or gangrene, recurrent: Secondary | ICD-10-CM | POA: Diagnosis not present

## 2023-05-05 ENCOUNTER — Other Ambulatory Visit: Payer: Self-pay | Admitting: Internal Medicine

## 2023-05-30 ENCOUNTER — Encounter: Payer: Self-pay | Admitting: Internal Medicine

## 2023-05-30 ENCOUNTER — Ambulatory Visit (INDEPENDENT_AMBULATORY_CARE_PROVIDER_SITE_OTHER): Payer: Medicare HMO | Admitting: Internal Medicine

## 2023-05-30 VITALS — BP 134/80 | HR 52 | Temp 97.7°F | Resp 16 | Ht 63.0 in | Wt 156.2 lb

## 2023-05-30 DIAGNOSIS — I10 Essential (primary) hypertension: Secondary | ICD-10-CM | POA: Diagnosis not present

## 2023-05-30 DIAGNOSIS — Z0001 Encounter for general adult medical examination with abnormal findings: Secondary | ICD-10-CM | POA: Diagnosis not present

## 2023-05-30 DIAGNOSIS — H9311 Tinnitus, right ear: Secondary | ICD-10-CM

## 2023-05-30 DIAGNOSIS — E7800 Pure hypercholesterolemia, unspecified: Secondary | ICD-10-CM

## 2023-05-30 DIAGNOSIS — Z0000 Encounter for general adult medical examination without abnormal findings: Secondary | ICD-10-CM

## 2023-05-30 LAB — CBC WITH DIFFERENTIAL/PLATELET
Basophils Absolute: 0.1 10*3/uL (ref 0.0–0.1)
Basophils Relative: 1.1 % (ref 0.0–3.0)
Eosinophils Absolute: 0.2 10*3/uL (ref 0.0–0.7)
Eosinophils Relative: 3.3 % (ref 0.0–5.0)
HCT: 40.3 % (ref 36.0–46.0)
Hemoglobin: 13.3 g/dL (ref 12.0–15.0)
Lymphocytes Relative: 28.3 % (ref 12.0–46.0)
Lymphs Abs: 2 10*3/uL (ref 0.7–4.0)
MCHC: 33.1 g/dL (ref 30.0–36.0)
MCV: 90 fL (ref 78.0–100.0)
Monocytes Absolute: 0.5 10*3/uL (ref 0.1–1.0)
Monocytes Relative: 7.2 % (ref 3.0–12.0)
Neutro Abs: 4.2 10*3/uL (ref 1.4–7.7)
Neutrophils Relative %: 60.1 % (ref 43.0–77.0)
Platelets: 266 10*3/uL (ref 150.0–400.0)
RBC: 4.48 Mil/uL (ref 3.87–5.11)
RDW: 13.9 % (ref 11.5–15.5)
WBC: 6.9 10*3/uL (ref 4.0–10.5)

## 2023-05-30 LAB — LIPID PANEL
Cholesterol: 192 mg/dL (ref 0–200)
HDL: 65 mg/dL (ref >39.00)
LDL Cholesterol: 107 mg/dL — ABNORMAL HIGH (ref 0–99)
NonHDL: 127.25
Total CHOL/HDL Ratio: 3
Triglycerides: 100 mg/dL (ref 0.0–149.0)
VLDL: 20 mg/dL (ref 0.0–40.0)

## 2023-05-30 LAB — COMPREHENSIVE METABOLIC PANEL
ALT: 20 U/L (ref 0–35)
AST: 22 U/L (ref 0–37)
Albumin: 4.3 g/dL (ref 3.5–5.2)
Alkaline Phosphatase: 66 U/L (ref 39–117)
BUN: 8 mg/dL (ref 6–23)
CO2: 32 meq/L (ref 19–32)
Calcium: 9.6 mg/dL (ref 8.4–10.5)
Chloride: 100 meq/L (ref 96–112)
Creatinine, Ser: 0.74 mg/dL (ref 0.40–1.20)
GFR: 79.43 mL/min (ref >60.00)
Glucose, Bld: 85 mg/dL (ref 70–99)
Potassium: 4.6 meq/L (ref 3.5–5.1)
Sodium: 139 meq/L (ref 135–145)
Total Bilirubin: 0.8 mg/dL (ref 0.2–1.2)
Total Protein: 6.9 g/dL (ref 6.0–8.3)

## 2023-05-30 MED ORDER — OMEGA 3-6-9 PO CAPS: ORAL_CAPSULE | ORAL | Status: AC

## 2023-05-30 MED ORDER — VITAMIN D3 50 MCG (2000 UT) PO CAPS: 2000.0000 [IU] | ORAL_CAPSULE | Freq: Every day | ORAL | Status: AC

## 2023-05-30 NOTE — Progress Notes (Signed)
 Subjective:    Patient ID: Teresa Burch, female    DOB: 1950/08/19, 73 y.o.   MRN: [id number]  DOS:  05/30/2023 Type of visit - description: CPX  Here for CPX. Saw surgery, note reviewed, has bilateral inguinal hernias. Complaining of a long history of tinnitus, mostly on the right, no HOH.  Review of Systems  Other than above, a 14 point review of systems is negative      Past Medical History:  Diagnosis Date   Hypertension     Past Surgical History:  Procedure Laterality Date   BREAST BIOPSY Left 03/2016   (-)   BTL     1980s   HERNIA REPAIR  ~2005   Social History   Socioeconomic History   Marital status: Legally Separated    Spouse name: Not on file   Number of children: 3   Years of education: Not on file   Highest education level: Not on file  Occupational History   Occupation: retired, [employer]  Tobacco Use   Smoking status: Never   Smokeless tobacco: Never  Vaping Use   Vaping status: Never Used  Substance and Sexual Activity   Alcohol use: No   Drug use: No   Sexual activity: Not Currently    Comment: Declined sexual hx questions  Other Topics Concern   Not on file  Social History Narrative   From [place]   Moved to [place]  1967   Lives by herself    Drives    3 daughters    Social Drivers of Corporate Investment Banker Strain: Not on file  Food Insecurity: Not on file  Transportation Needs: Not on file  Physical Activity: Not on file  Stress: Not on file  Social Connections: Not on file  Intimate Partner Violence: Not on file     Current Outpatient Medications  Medication Instructions   losartan -hydrochlorothiazide (HYZAAR) 100-25 MG tablet 1 tablet, Oral, Daily   Omega 3-6-9 CAPS OTC   Probiotic Product (PROBIOTIC DAILY PO) 1 tablet, Daily   Vitamin D3 2,000 Units, Oral, Daily       Objective:   Physical Exam BP 134/80   Pulse (!) 52   Temp 97.7 F (36.5 C) (Oral)   Resp 16   Ht 5' 3 (1.6 m)   Wt 156 lb 4 oz  (70.9 kg)   BMI 27.68 kg/m  General: Well developed, NAD, BMI noted Neck: No  thyromegaly  HEENT:  Normocephalic . Face symmetric, atraumatic. Cerumen impaction noted. No HOH noted. Lungs:  CTA B Normal respiratory effort, no intercostal retractions, no accessory muscle use. Heart: RRR,  no murmur.  Abdomen:  Not distended, soft, non-tender. No rebound or rigidity.   Lower extremities: no pretibial edema bilaterally  Skin: Exposed areas without rash. Not pale. Not jaundice Neurologic:  alert & oriented X3.  Speech normal, gait appropriate for age and unassisted Strength symmetric and appropriate for age.  Psych: Cognition and judgment appear intact.  Cooperative with normal attention span and concentration.  Behavior appropriate. No anxious or depressed appearing.     Assessment     Assessment  (new pt 12-2015, previous PCP dr Laurelyn, @ [place], [place]) HTN - dx ~ 2015 High cholesterol : declines statins consistently L Breast biopsy (-) 03/2016   PLAN: Here for CPX Immunizations: Has consistently declined immunizations and declined again today. -Female care:  LOV gyn 03-2020 , no further PAPs Normal DEXA 02/2016, declines re-check again today.  Encouraged to take  vitamin D  2000 units daily MMG last 06/2022 -CCS: (-)  IFOB 08-2019 , 03-2021; 05/2022; options d/w pt:  agreed IFOB -Diet and  Exercise discussed.    - labs:  CMP CBC FLP -Encouraged to get a healthcare POA Other issues addressed today: HTN: On Hyzaar, recommend ambulatory BPs, check labs. EKG today: Sinus rhythm, no acute changes, essentially no changes from previous EKG Dyslipidemia: Diet controlled, states she will not take cholesterol medication but  requests a FLP today. Bilateral inguinal hernias: Saw surgery 05/02/2023,  was rec surgery. Tinnitus: Without HOH, mostly right-sided, recommend to make a nurse appointment for ear lavage and reassessment All instructions discussed in Spanish. RTC 1 year

## 2023-05-30 NOTE — Patient Instructions (Addendum)
  Check the  blood pressure regularly Blood pressure goal:  between 110/65 and  135/85. If it is consistently higher or lower, let me know     GO TO THE LAB : Get the blood work     Please go to the front desk and schedule a nurse appointment for ear lavage. Next visit with me 1 year for a physical exam         Health Care Power of attorney ,  Living will (Advance care planning documents)  If you already have a living will or healthcare power of attorney, is recommended you bring the copy to be scanned in your chart.   The document will be available to all the doctors you see in the system.  Advance care planning is a process that supports adults in  understanding and sharing their preferences regarding future medical care.  The patient's preferences are recorded in documents called Advance Directives and the can be modified at any time while the patient is in full mental capacity.   If you don't have one, please consider create one.      More information at: [email or web address]

## 2023-05-31 ENCOUNTER — Encounter: Payer: Self-pay | Admitting: Internal Medicine

## 2023-05-31 NOTE — Assessment & Plan Note (Signed)
 Here for CPX Immunizations: Has consistently declined immunizations and declined again today. -Female care:  LOV gyn 03-2020 , no further PAPs Normal DEXA 02/2016, declines re-check again today.  Encouraged to take vitamin D  2000 units daily MMG last 06/2022 -CCS: (-)  IFOB 08-2019 , 03-2021; 05/2022; options d/w pt:  agreed IFOB -Diet and  Exercise discussed.    - labs:  CMP CBC FLP -Encouraged to get a healthcare POA

## 2023-05-31 NOTE — Assessment & Plan Note (Signed)
 Here for CPX Other issues addressed today: HTN: On Hyzaar, recommend ambulatory BPs, check labs. EKG today: Sinus rhythm, no acute changes, essentially no changes from previous EKG Dyslipidemia: Diet controlled, states she will not take cholesterol medication but  requests a FLP today. Bilateral inguinal hernias: Saw surgery 05/02/2023,  was rec surgery. Tinnitus: Without HOH, mostly right-sided, recommend to make a nurse appointment for ear lavage and reassessment All instructions discussed in Spanish. RTC 1 year

## 2023-06-13 ENCOUNTER — Ambulatory Visit: Payer: Medicare HMO | Admitting: Internal Medicine

## 2023-06-13 ENCOUNTER — Other Ambulatory Visit (INDEPENDENT_AMBULATORY_CARE_PROVIDER_SITE_OTHER): Payer: Medicare HMO

## 2023-06-13 DIAGNOSIS — Z0000 Encounter for general adult medical examination without abnormal findings: Secondary | ICD-10-CM | POA: Diagnosis not present

## 2023-06-16 LAB — FECAL OCCULT BLOOD, IMMUNOCHEMICAL: Fecal Occult Bld: NEGATIVE

## 2023-06-17 ENCOUNTER — Encounter: Payer: Self-pay | Admitting: Neurology

## 2023-06-27 DIAGNOSIS — K4020 Bilateral inguinal hernia, without obstruction or gangrene, not specified as recurrent: Secondary | ICD-10-CM | POA: Diagnosis not present

## 2023-08-04 ENCOUNTER — Other Ambulatory Visit: Payer: Self-pay | Admitting: Internal Medicine

## 2023-08-04 MED ORDER — LOSARTAN POTASSIUM-HCTZ 100-25 MG PO TABS: 1.0000 | ORAL_TABLET | Freq: Every day | ORAL | 0 refills | Status: DC

## 2023-08-04 NOTE — Telephone Encounter (Signed)
 Copied from CRM [id number]. Topic: Clinical - Medication Refill >> Aug 04, 2023  9:29 AM Annelle Kiel wrote: Most Recent Primary Care Visit:  Provider: [facility]  Department[facility]  Visit Type: LAB VISIT  Date: 06/13/2023  Medication: losartan-hydrochlorothiazide (HYZAAR) 100-25 MG tablet [[id number]]  Has the patient contacted their pharmacy? Yes (Agent: If no, request that the patient contact the pharmacy for the refill. If patient does not wish to contact the pharmacy document the reason why and proceed with request.) (Agent: If yes, when and what did the pharmacy advise?)  Is this the correct pharmacy for this prescription? Yes If no, delete pharmacy and type the correct one.  This is the patient's preferred pharmacy:  [facility] [id number] [place], [place] - [address] [place] [address] Phone: [phone] Fax: [phone]   Has the prescription been filled recently? No  Is the patient out of the medication? Yes  Has the patient been seen for an appointment in the last year OR does the patient have an upcoming appointment? Yes  Can we respond through MyChart? No  Agent: Please be advised that Rx refills may take up to 3 business days. We ask that you follow-up with your pharmacy.

## 2023-10-23 ENCOUNTER — Encounter: Payer: Self-pay | Admitting: Internal Medicine

## 2023-10-29 ENCOUNTER — Other Ambulatory Visit: Payer: Self-pay | Admitting: Internal Medicine

## 2023-10-29 NOTE — Telephone Encounter (Unsigned)
 Copied from CRM [id number]. Topic: Clinical - Medication Refill >> Oct 29, 2023  4:50 PM Chiquita SQUIBB wrote: Medication: losartan -hydrochlorothiazide (HYZAAR) 100-25 MG tablet [[id number]]  Has the patient contacted their pharmacy? Yes (Agent: If no, request that the patient contact the pharmacy for the refill. If patient does not wish to contact the pharmacy document the reason why and proceed with request.) (Agent: If yes, when and what did the pharmacy advise?)  This is the patient's preferred pharmacy:  [facility] [id number] [place], [place] - [address] [place] [address] Phone: [phone] Fax: [phone]  Is this the correct pharmacy for this prescription? Yes If no, delete pharmacy and type the correct one.   Has the prescription been filled recently? No  Is the patient out of the medication? No- Patient has one more  Has the patient been seen for an appointment in the last year OR does the patient have an upcoming appointment? Yes  Can we respond through MyChart? No  Agent: Please be advised that Rx refills may take up to 3 business days. We ask that you follow-up with your pharmacy.

## 2023-10-30 ENCOUNTER — Other Ambulatory Visit: Payer: Self-pay | Admitting: Internal Medicine

## 2023-10-31 ENCOUNTER — Telehealth: Payer: Self-pay

## 2023-10-31 MED ORDER — LOSARTAN POTASSIUM-HCTZ 100-25 MG PO TABS: 1.0000 | ORAL_TABLET | Freq: Every day | ORAL | 0 refills | Status: DC

## 2023-10-31 NOTE — Telephone Encounter (Signed)
 Copied from CRM [id number]. Topic: Clinical - Medication Refill >> Oct 29, 2023  4:50 PM Chiquita SQUIBB wrote: Medication: losartan -hydrochlorothiazide (HYZAAR) 100-25 MG tablet [[id number]]  Has the patient contacted their pharmacy? Yes (Agent: If no, request that the patient contact the pharmacy for the refill. If patient does not wish to contact the pharmacy document the reason why and proceed with request.) (Agent: If yes, when and what did the pharmacy advise?)  This is the patient's preferred pharmacy:  [facility] [id number] [place], [place] - [address] [place] [address] Phone: [phone] Fax: [phone]  Is this the correct pharmacy for this prescription? Yes If no, delete pharmacy and type the correct one.   Has the prescription been filled recently? No  Is the patient out of the medication? No- Patient has one more  Has the patient been seen for an appointment in the last year OR does the patient have an upcoming appointment? Yes  Can we respond through MyChart? No  Agent: Please be advised that Rx refills may take up to 3 business days. We ask that you follow-up with your pharmacy. >> Oct 31, 2023 11:59 AM Rosina BIRCH wrote: Patient daughter bufford) with the patient on the phone is calling about a refill and she does not have any medication left. Patient called the pharmacy and they stated she will need an appointment before any refills can be given but the patient stated she was seen by the doctor in February. Patient has an appointment on 7/15 and she was told she will not get any medication until her appointment. Patient need clarification on the medication refill  CB [phone]

## 2023-11-04 ENCOUNTER — Ambulatory Visit: Admitting: Internal Medicine

## 2023-11-07 ENCOUNTER — Ambulatory Visit (INDEPENDENT_AMBULATORY_CARE_PROVIDER_SITE_OTHER): Admitting: Internal Medicine

## 2023-11-07 ENCOUNTER — Ambulatory Visit: Admitting: Internal Medicine

## 2023-11-07 ENCOUNTER — Encounter: Payer: Self-pay | Admitting: Internal Medicine

## 2023-11-07 VITALS — BP 134/82 | HR 74 | Temp 97.9°F | Resp 16 | Ht 63.0 in | Wt 154.2 lb

## 2023-11-07 DIAGNOSIS — Z2821 Immunization not carried out because of patient refusal: Secondary | ICD-10-CM | POA: Diagnosis not present

## 2023-11-07 DIAGNOSIS — Z1231 Encounter for screening mammogram for malignant neoplasm of breast: Secondary | ICD-10-CM

## 2023-11-07 DIAGNOSIS — I10 Essential (primary) hypertension: Secondary | ICD-10-CM | POA: Diagnosis not present

## 2023-11-07 DIAGNOSIS — E7800 Pure hypercholesterolemia, unspecified: Secondary | ICD-10-CM

## 2023-11-07 LAB — COMPREHENSIVE METABOLIC PANEL WITH GFR
ALT: 19 U/L (ref 0–35)
AST: 26 U/L (ref 0–37)
Albumin: 4.4 g/dL (ref 3.5–5.2)
Alkaline Phosphatase: 59 U/L (ref 39–117)
BUN: 11 mg/dL (ref 6–23)
CO2: 32 meq/L (ref 19–32)
Calcium: 9.7 mg/dL (ref 8.4–10.5)
Chloride: 99 meq/L (ref 96–112)
Creatinine, Ser: 0.82 mg/dL (ref 0.40–1.20)
GFR: 70.01 mL/min (ref >60.00)
Glucose, Bld: 95 mg/dL (ref 70–99)
Potassium: 4 meq/L (ref 3.5–5.1)
Sodium: 139 meq/L (ref 135–145)
Total Bilirubin: 0.7 mg/dL (ref 0.2–1.2)
Total Protein: 7.1 g/dL (ref 6.0–8.3)

## 2023-11-07 MED ORDER — LOSARTAN POTASSIUM-HCTZ 100-25 MG PO TABS: 1.0000 | ORAL_TABLET | Freq: Every day | ORAL | 1 refills | Status: AC

## 2023-11-07 NOTE — Progress Notes (Signed)
   Subjective:    Patient ID: Teresa Burch, female    DOB: Nov 26, 1950, 73 y.o.   MRN: [id number]  DOS:  11/07/2023 Type of visit - description: Routine office visit  Feeling well.  No major concerns.   Review of Systems See above   Past Medical History:  Diagnosis Date   Hypertension     Past Surgical History:  Procedure Laterality Date   BREAST BIOPSY Left 03/2016   (-)   BTL     1980s   HERNIA REPAIR  ~2005    Current Outpatient Medications  Medication Instructions   losartan -hydrochlorothiazide (HYZAAR) 100-25 MG tablet 1 tablet, Oral, Daily   Omega 3-6-9 CAPS OTC   Probiotic Product (PROBIOTIC DAILY PO) 1 tablet, Daily   Vitamin D3 2,000 Units, Oral, Daily       Objective:   Physical Exam BP 134/82   Pulse 74   Temp 97.9 F (36.6 C) (Oral)   Resp 16   Ht 5' 3 (1.6 m)   Wt 154 lb 4 oz (70 kg)   BMI 27.32 kg/m  General:   Well developed, NAD, BMI noted. HEENT:  Normocephalic . Face symmetric, atraumatic Lungs:  CTA B Normal respiratory effort, no intercostal retractions, no accessory muscle use. Heart: RRR,  no murmur.  Lower extremities: no pretibial edema bilaterally  Skin: Not pale. Not jaundice Neurologic:  alert & oriented X3.  Speech normal, gait appropriate for age and unassisted Psych--  Cognition and judgment appear intact.  Cooperative with normal attention span and concentration.  Behavior appropriate. No anxious or depressed appearing.      Assessment    Assessment  (new pt 12-2015, previous PCP dr Laurelyn, @ [place], [place]) HTN - dx ~ 2015 High cholesterol : declines statins consistently L Breast biopsy (-) 03/2016  PLAN  HTN: Reports good compliance with Hyzaar, BP today is satisfactory, check BMP, RF sent. Preventive care: Due for a mammogram, referral sent. RTC 05/2024 CPX

## 2023-11-07 NOTE — Patient Instructions (Addendum)
   Check the  blood pressure regularly Blood pressure goal:  between 110/65 and  135/85. If it is consistently higher or lower, let me know     GO TO THE LAB :  Get the blood work   Your results will be posted on MyChart with my comments  Next office visit for a physical exam by 05/2024 Please make an appointment before you leave today    STOP BY THE FIRST FLOOR: Arrange for a mammogram

## 2023-11-08 NOTE — Assessment & Plan Note (Signed)
 HTN: Reports good compliance with Hyzaar, BP today is satisfactory, check BMP, RF sent. Preventive care: Due for a mammogram, referral sent. RTC 05/2024 CPX

## 2023-11-10 ENCOUNTER — Ambulatory Visit: Payer: Self-pay | Admitting: Internal Medicine

## 2023-11-18 ENCOUNTER — Inpatient Hospital Stay (HOSPITAL_BASED_OUTPATIENT_CLINIC_OR_DEPARTMENT_OTHER): Admission: RE | Admit: 2023-11-18 | Source: Ambulatory Visit

## 2024-06-04 ENCOUNTER — Encounter: Admitting: Internal Medicine
